# Patient Record
Sex: Female | Born: 1955 | Race: White | Hispanic: No | Marital: Married | Smoking: Former smoker
Health system: Southern US, Community
[De-identification: ages and names within clinical notes are randomized; demographics above are authoritative.]

## PROBLEM LIST (undated history)

## (undated) VITALS — BP 126/80 | HR 92 | Temp 98.3°F | Resp 18 | Ht 61.42 in | Wt 147.0 lb

## (undated) DIAGNOSIS — K219 Gastro-esophageal reflux disease without esophagitis: Secondary | ICD-10-CM

## (undated) DIAGNOSIS — F419 Anxiety disorder, unspecified: Secondary | ICD-10-CM

## (undated) DIAGNOSIS — IMO0002 Reserved for concepts with insufficient information to code with codable children: Secondary | ICD-10-CM

## (undated) DIAGNOSIS — J449 Chronic obstructive pulmonary disease, unspecified: Secondary | ICD-10-CM

## (undated) DIAGNOSIS — M1990 Unspecified osteoarthritis, unspecified site: Secondary | ICD-10-CM

## (undated) DIAGNOSIS — M810 Age-related osteoporosis without current pathological fracture: Secondary | ICD-10-CM

## (undated) DIAGNOSIS — E785 Hyperlipidemia, unspecified: Secondary | ICD-10-CM

## (undated) DIAGNOSIS — T7840XA Allergy, unspecified, initial encounter: Secondary | ICD-10-CM

## (undated) DIAGNOSIS — F1020 Alcohol dependence, uncomplicated: Secondary | ICD-10-CM

## (undated) HISTORY — DX: Unspecified osteoarthritis, unspecified site: M19.90

## (undated) HISTORY — PX: TUBAL LIGATION: SHX77

## (undated) HISTORY — DX: Anxiety disorder, unspecified: F41.9

## (undated) HISTORY — DX: Chronic obstructive pulmonary disease, unspecified: J44.9

## (undated) HISTORY — DX: Allergy, unspecified, initial encounter: T78.40XA

## (undated) HISTORY — DX: Gastro-esophageal reflux disease without esophagitis: K21.9

## (undated) HISTORY — PX: HERNIA REPAIR: SHX51

## (undated) HISTORY — PX: JOINT REPLACEMENT: SHX530

## (undated) HISTORY — PX: CYSTOCELE REPAIR: SHX163

---

## 2012-08-14 ENCOUNTER — Emergency Department (INDEPENDENT_AMBULATORY_CARE_PROVIDER_SITE_OTHER)
Admission: EM | Admit: 2012-08-14 | Discharge: 2012-08-14 | Disposition: A | Discharge status: Home / Self Care | Source: Home / Self Care | Attending: Emergency Medicine | Admitting: Emergency Medicine

## 2012-08-14 ENCOUNTER — Encounter: Payer: Self-pay | Admitting: *Deleted

## 2012-08-14 DIAGNOSIS — H01003 Unspecified blepharitis right eye, unspecified eyelid: Secondary | ICD-10-CM

## 2012-08-14 DIAGNOSIS — H01009 Unspecified blepharitis unspecified eye, unspecified eyelid: Secondary | ICD-10-CM

## 2012-08-14 HISTORY — DX: Hyperlipidemia, unspecified: E78.5

## 2012-08-14 HISTORY — DX: Age-related osteoporosis without current pathological fracture: M81.0

## 2012-08-14 MED ORDER — ERYTHROMYCIN 2 % EX OINT: TOPICAL_OINTMENT | CUTANEOUS | Status: DC

## 2012-08-14 MED ORDER — LEVOCETIRIZINE DIHYDROCHLORIDE 5 MG PO TABS: 5.0000 mg | ORAL_TABLET | Freq: Every evening | ORAL | Status: DC

## 2012-08-14 NOTE — ED Notes (Signed)
Pt c/o red, dry and cracking skin on her lids on and off x . She has applied Vaseline with minimal relief. She also c/o sneezing, runny nose and allergy s/s x 4 days.

## 2012-08-14 NOTE — ED Provider Notes (Signed)
History     CSN: [id number]  Arrival date & time 08/14/12  4098   First MD Initiated Contact with Patient 08/14/12 [phone]      Chief Complaint  Patient presents with  . Eye Problem    (Consider location/radiation/quality/duration/timing/severity/associated sxs/prior treatment) HPI Ann [name] presents today with an EYE COMPLAINT.  She has noticed some dry, cracking, scaly on her eyelids off and on for the last few months.  She went to another facility who suggested that she apply Vaseline but it has not been helping very much.  She's also been having some her normal allergy symptoms such as sneezing runny nose for the last week.  She has used [facility] [facility] in the past which is not tender help her very much.  She is mostly concerned about the dry eyelids.  Location: bilateral  Onset: 4 months   Symptoms: Redness: no Discharge: no Pain: no Photophobia: no Decreased Vision: no URI symptoms: no Itching/Allergy sxs: yes Glaucoma: no Recent eye surgery: no Contact lens use: no  Red Flags Trauma: no Foreign Body: no Vomiting/HA: no Halos around lights: no Chickenpox or zoster: no    Past Medical History  Diagnosis Date  . Hyperlipemia   . Osteoporosis     Past Surgical History  Procedure Laterality Date  . Tubal ligation    . Cystocele repair      Family History  Problem Relation Age of Onset  . Cancer Mother     breast  . Cancer Father     prostate    History  Substance Use Topics  . Smoking status: Current Every Day Smoker -- 0.50 packs/day  . Smokeless tobacco: Not on file  . Alcohol Use: Yes    OB History   Grav Para Term Preterm Abortions TAB SAB Ect Mult Living                  Review of Systems  All other systems reviewed and are negative.    Allergies  Naproxen  Home Medications   Current Outpatient Rx  Name  Route  Sig  Dispense  Refill  . denosumab (PROLIA) 60 MG/ML SOLN injection   Subcutaneous   Inject 60 mg into the skin every 6 (six)  months. Administer in upper arm, thigh, or abdomen         . simvastatin (ZOCOR) 40 MG tablet   Oral   Take 40 mg by mouth every evening.         Marland Kitchen VITAMIN D, CHOLECALCIFEROL, PO   Oral   Take by mouth.         . Erythromycin 2 % ointment      Apply to eyelids 1-2 times per day   25 g   1   . levocetirizine (XYZAL) 5 MG tablet   Oral   Take 1 tablet (5 mg total) by mouth every evening.   30 tablet   2     BP 122/77  Pulse 83  Temp(Src) 98.2 F (36.8 C) (Oral)  Resp 18  Ht 5\' 3"  (1.6 m)  Wt 144 lb (65.318 kg)  BMI 25.51 kg/m2  SpO2 98%  Physical Exam  Nursing note and vitals reviewed. Constitutional: She is oriented to person, place, and time. She appears well-developed and well-nourished.  HENT:  Head: Normocephalic and atraumatic.  Eyes: EOM are normal. Pupils are equal, round, and reactive to light. Right eye exhibits no discharge and no exudate. No foreign body present in the right eye. Left eye  exhibits no discharge and no exudate. No foreign body present in the left eye. No scleral icterus.  Eyelids slightly erythematous, no cellulitis, dry skin, mild scaling, upper lids > lower.  Neck: Neck supple.  Cardiovascular: Regular rhythm and normal heart sounds.   Pulmonary/Chest: Effort normal and breath sounds normal. No respiratory distress.  Neurological: She is alert and oriented to person, place, and time.  Skin: Skin is warm and dry.  Psychiatric: She has a normal mood and affect. Her speech is normal.    ED Course  Procedures (including critical care time)  Labs Reviewed - No data to display No results found.   1. Blepharitis of both eyes       MDM   The patient appears to have blepharitis of both eyelids.  I instructed her on the treatment which include using diluted baby shampoo once or twice a day to remove the scales and open up the oil glands.  Also can use over-the-counter hydrocortisone cream but only for a few days.  I also gave her  prescription for topical erythromycin ointment which does tend to help this as well.  She also asked for a prescription antihistamine so I prescribed her Xyzal but educated her that probably over-the-counter medication such as Claritin, Allegra, or Zyrtec work just as well.  Educated her on avoiding allergens.  Patient to followup with her primary care physician for an ophthalmologist or if her eye symptoms are not improving.    Marlaine Hind, MD 08/14/12 [phone]

## 2012-08-26 ENCOUNTER — Emergency Department (INDEPENDENT_AMBULATORY_CARE_PROVIDER_SITE_OTHER)
Admission: EM | Admit: 2012-08-26 | Discharge: 2012-08-26 | Disposition: A | Discharge status: Home / Self Care | Source: Home / Self Care | Attending: Family Medicine | Admitting: Family Medicine

## 2012-08-26 ENCOUNTER — Encounter: Payer: Self-pay | Admitting: Emergency Medicine

## 2012-08-26 ENCOUNTER — Emergency Department

## 2012-08-26 ENCOUNTER — Telehealth: Payer: Self-pay | Admitting: Emergency Medicine

## 2012-08-26 ENCOUNTER — Emergency Department (INDEPENDENT_AMBULATORY_CARE_PROVIDER_SITE_OTHER)

## 2012-08-26 DIAGNOSIS — M7062 Trochanteric bursitis, left hip: Secondary | ICD-10-CM

## 2012-08-26 DIAGNOSIS — M1610 Unilateral primary osteoarthritis, unspecified hip: Secondary | ICD-10-CM

## 2012-08-26 DIAGNOSIS — M7061 Trochanteric bursitis, right hip: Secondary | ICD-10-CM

## 2012-08-26 DIAGNOSIS — M76899 Other specified enthesopathies of unspecified lower limb, excluding foot: Secondary | ICD-10-CM

## 2012-08-26 DIAGNOSIS — M1612 Unilateral primary osteoarthritis, left hip: Secondary | ICD-10-CM

## 2012-08-26 DIAGNOSIS — M545 Low back pain, unspecified: Secondary | ICD-10-CM

## 2012-08-26 DIAGNOSIS — M25559 Pain in unspecified hip: Secondary | ICD-10-CM

## 2012-08-26 DIAGNOSIS — M169 Osteoarthritis of hip, unspecified: Secondary | ICD-10-CM

## 2012-08-26 MED ORDER — TRAMADOL HCL 50 MG PO TABS: ORAL_TABLET | ORAL | Status: DC

## 2012-08-26 NOTE — ED Notes (Signed)
Low back pain and hip pain after walking around at the [facility] yesterday 7/10, constant

## 2012-08-26 NOTE — ED Provider Notes (Signed)
History     CSN: [id number]  Arrival date & time 08/26/12  1134   First MD Initiated Contact with Patient 08/26/12 1159      Chief Complaint  Patient presents with  . Back Pain       HPI Comments: Patient states that she was walking in the [employer] yesterday for about an hour and developed pain in both hips, worse on the left.  Later in the afternoon, about 4:30PM she developed lower back pain that has become worse this morning.  The pain occasionally radiates to her anterior thighs.  She now has pain with any movement.  No bowel or bladder dysfunction; no saddle numbness. She had a DEXA scan four months ago that showed osteoporosis.  She has had increasing pain in the lower back and both hips.  She was started on Prolia about two months ago.  Patient is a 57 y.o. female presenting with back pain. The history is provided by the patient.  Back Pain Location:  Sacro-iliac joint and gluteal region Quality:  Aching Radiates to: anterior thighs. Pain severity:  Moderate Pain is:  Same all the time Onset quality:  Gradual Duration:  1 day Timing:  Constant Progression:  Worsening Chronicity:  Recurrent Context comment:  Recent walking Relieved by:  Nothing Worsened by:  Ambulation, twisting and movement Ineffective treatments:  Heating pad Associated symptoms: no abdominal pain, no abdominal swelling, no bladder incontinence, no bowel incontinence, no chest pain, no fever, no headaches, no leg pain, no numbness, no paresthesias, no pelvic pain, no perianal numbness, no tingling, no weakness and no weight loss   Risk factors: hx of osteoporosis and menopause     Past Medical History  Diagnosis Date  . Hyperlipemia   . Osteoporosis     Past Surgical History  Procedure Laterality Date  . Tubal ligation    . Cystocele repair      Family History  Problem Relation Age of Onset  . Cancer Mother     breast  . Cancer Father     prostate    History  Substance Use  Topics  . Smoking status: Current Every Day Smoker -- 0.50 packs/day for 40 years  . Smokeless tobacco: Not on file  . Alcohol Use: Yes    OB History   Grav Para Term Preterm Abortions TAB SAB Ect Mult Living                  Review of Systems  Constitutional: Negative for fever and weight loss.  Cardiovascular: Negative for chest pain.  Gastrointestinal: Negative for abdominal pain and bowel incontinence.  Genitourinary: Negative for bladder incontinence and pelvic pain.  Musculoskeletal: Positive for back pain.  Neurological: Negative for tingling, weakness, numbness, headaches and paresthesias.    Allergies  Naproxen  Home Medications   Current Outpatient Rx  Name  Route  Sig  Dispense  Refill  . denosumab (PROLIA) 60 MG/ML SOLN injection   Subcutaneous   Inject 60 mg into the skin every 6 (six) months. Administer in upper arm, thigh, or abdomen         . Erythromycin 2 % ointment      Apply to eyelids 1-2 times per day   25 g   1   . levocetirizine (XYZAL) 5 MG tablet   Oral   Take 1 tablet (5 mg total) by mouth every evening.   30 tablet   2   . simvastatin (ZOCOR) 40 MG tablet  Oral   Take 40 mg by mouth every evening.         . traMADol (ULTRAM) 50 MG tablet      Take one or two tabs by mouth at bedtime as needed for pain   15 tablet   0   . VITAMIN D, CHOLECALCIFEROL, PO   Oral   Take by mouth.           BP 132/83  Pulse 90  Temp(Src) 98.4 F (36.9 C) (Oral)  Ht 5\' 3"  (1.6 m)  Wt 140 lb (63.504 kg)  BMI 24.81 kg/m2  SpO2 96%  Physical Exam Nursing notes and Vital Signs reviewed. Appearance:  Patient appears healthy, stated age, and in no acute distress Eyes:  Pupils are equal, round, and reactive to light and accomodation.  Extraocular movement is intact.  Conjunctivae are not inflamed  Pharynx:  Normal Neck:  Supple.  No adenopathy Lungs:  Clear to auscultation.  Breath sounds are equal.  Heart:  Regular rate and rhythm  without murmurs, rubs, or gallops.  Abdomen:  Nontender without masses or hepatosplenomegaly.  Bowel sounds are present.  No CVA or flank tenderness.  Extremities:  No edema.  No calf tenderness.  Both hips reveals distinct tenderness over the greater trochanters, worse on the left.  Palpating the greater trochanters during resisted lateral abduction of the hips recreates her pain.  Skin:  No rash present.  Back:  Decreased range of motion.  Can heel/toe walk and squat without difficulty.   There is tenderness in the mid-line approximately L3-L4.  There is tenderness over both SI joints.  Straight leg raising test is negative.  Sitting knee extension test is negative.  Strength and sensation in the lower extremities is normal.  Patellar and achilles reflexes are normal     ED Course  Procedures  none   Dg Pelvis 1-2 Views  08/26/2012  *RADIOLOGY REPORT*  Clinical Data: Bilateral hip pain.  PELVIS - 1-2 VIEW  Comparison: None  Findings: Both hips are normally located.  There are moderate degenerative changes on the left with joint space narrowing and osteophytic spurring.  No findings for acute hip fracture or avascular necrosis.  The pubic symphysis and SI joints are intact. No degenerative changes.  IMPRESSION:  1.  Left hip joint degenerative changes. 2.  No acute bony findings.   Original Report Authenticated By: Rudie Meyer, M.D.      1. Osteoarthritis of left hip   2. Low back pain; suspect SI joint pain   3. Trochanteric bursitis of both hips       MDM  Rx written for tramadol at bedtime. Because of chronic nature of patient's hip and back pain, will refer to Dr. Rodney Langton for management.        Lattie Haw, MD 08/26/12 [phone]

## 2012-08-28 DIAGNOSIS — M51369 Other intervertebral disc degeneration, lumbar region without mention of lumbar back pain or lower extremity pain: Secondary | ICD-10-CM | POA: Insufficient documentation

## 2012-08-28 DIAGNOSIS — M5136 Other intervertebral disc degeneration, lumbar region: Secondary | ICD-10-CM | POA: Insufficient documentation

## 2012-10-10 ENCOUNTER — Encounter: Payer: Self-pay | Admitting: *Deleted

## 2012-10-10 ENCOUNTER — Emergency Department (INDEPENDENT_AMBULATORY_CARE_PROVIDER_SITE_OTHER)
Admission: EM | Admit: 2012-10-10 | Discharge: 2012-10-10 | Disposition: A | Discharge status: Home / Self Care | Source: Home / Self Care | Attending: Emergency Medicine | Admitting: Emergency Medicine

## 2012-10-10 DIAGNOSIS — N390 Urinary tract infection, site not specified: Secondary | ICD-10-CM

## 2012-10-10 LAB — POCT URINALYSIS DIP (MANUAL ENTRY)
Nitrite, UA: POSITIVE
pH, UA: 5 (ref 5–8)

## 2012-10-10 MED ORDER — CIPROFLOXACIN HCL 500 MG PO TABS: 500.0000 mg | ORAL_TABLET | Freq: Two times a day (BID) | ORAL | Status: DC

## 2012-10-10 NOTE — ED Provider Notes (Signed)
History     CSN: [id number]  Arrival date & time 10/10/12  1615   First MD Initiated Contact with Patient 10/10/12 1625      Chief Complaint  Patient presents with  . Dysuria    (Consider location/radiation/quality/duration/timing/severity/associated sxs/prior treatment) HPI Ann [name] is a 57 y.o. female who presents today with UTI symptoms for 1 day.  Taking AZO x1 dose which helps slightly. + dysuria + frequency + urgency No hematuria No vaginal discharge No fever/chills No lower abdominal pain No new back pain No fatigue    Past Medical History  Diagnosis Date  . Hyperlipemia   . Osteoporosis     Past Surgical History  Procedure Laterality Date  . Tubal ligation    . Cystocele repair      Family History  Problem Relation Age of Onset  . Cancer Mother     breast  . Cancer Father     prostate    History  Substance Use Topics  . Smoking status: Current Every Day Smoker -- 0.50 packs/day for 40 years    Types: Cigarettes  . Smokeless tobacco: Never Used  . Alcohol Use: Yes    OB History   Grav Para Term Preterm Abortions TAB SAB Ect Mult Living                  Review of Systems  All other systems reviewed and are negative.    Allergies  Naproxen  Home Medications   Current Outpatient Rx  Name  Route  Sig  Dispense  Refill  . ciprofloxacin (CIPRO) 500 MG tablet   Oral   Take 1 tablet (500 mg total) by mouth 2 (two) times daily.   10 tablet   0   . denosumab (PROLIA) 60 MG/ML SOLN injection   Subcutaneous   Inject 60 mg into the skin every 6 (six) months. Administer in upper arm, thigh, or abdomen         . Erythromycin 2 % ointment      Apply to eyelids 1-2 times per day   25 g   1   . levocetirizine (XYZAL) 5 MG tablet   Oral   Take 1 tablet (5 mg total) by mouth every evening.   30 tablet   2   . simvastatin (ZOCOR) 40 MG tablet   Oral   Take 40 mg by mouth every evening.         . traMADol (ULTRAM) 50 MG tablet      Take one or two tabs by mouth at bedtime as needed for pain   15 tablet   0   . VITAMIN D, CHOLECALCIFEROL, PO   Oral   Take by mouth.           BP 133/80  Pulse 89  Temp(Src) 98.5 F (36.9 C) (Oral)  Resp 12  Ht 5\' 2"  (1.575 m)  Wt 139 lb (63.05 kg)  BMI 25.42 kg/m2  SpO2 95%  Physical Exam  Nursing note and vitals reviewed. Constitutional: She is oriented to person, place, and time. She appears well-developed and well-nourished.  HENT:  Head: Normocephalic and atraumatic.  Eyes: No scleral icterus.  Neck: Neck supple.  Cardiovascular: Regular rhythm and normal heart sounds.   Pulmonary/Chest: Effort normal and breath sounds normal. No respiratory distress.  Abdominal: Soft. Normal appearance and bowel sounds are normal. She exhibits no mass. There is no rebound, no guarding and no CVA tenderness.  Neurological: She is alert and oriented  to person, place, and time.  Skin: Skin is warm and dry.  Psychiatric: She has a normal mood and affect. Her speech is normal.    ED Course  Procedures (including critical care time)  Labs Reviewed  URINE CULTURE  POCT URINALYSIS DIP (MANUAL ENTRY)   No results found.   1. Urinary tract infection, site not specified    Results for orders placed during the hospital encounter of 10/10/12  POCT URINALYSIS DIP (MANUAL ENTRY)      Result Value Range   Color, UA       Clarity, UA clear     Glucose, UA =250     Bilirubin, UA small     Bilirubin, UA small (15)     Spec Grav, UA <=1.005  1.005 - 1.03   Blood, UA negative     pH, UA 5.0  5 - 8   Protein Ur, POC =100     Urobilinogen, UA 4.0  0 - 1   Nitrite, UA Positive     Leukocytes, UA large (3+)        MDM  1) Take the prescribed antibiotic as directed. 2) A urinalysis was done in clinic.  A urine culture is pending. 3) Follow up with your PCP or urologist if not improving or if worsening symptoms.   Marlaine Hind, MD 10/10/12 [phone]

## 2012-10-10 NOTE — ED Notes (Signed)
Sanii c/o dysuria and polyuria x today. Denies fever.

## 2012-10-13 ENCOUNTER — Telehealth: Payer: Self-pay

## 2012-10-13 LAB — URINE CULTURE

## 2012-10-13 NOTE — ED Notes (Signed)
Left a message on voice mail asking how patient is feeling and advising to call back with any questions or concerns.  

## 2012-10-16 ENCOUNTER — Telehealth: Payer: Self-pay | Admitting: *Deleted

## 2012-10-18 ENCOUNTER — Telehealth: Payer: Self-pay | Admitting: *Deleted

## 2013-05-15 ENCOUNTER — Emergency Department (INDEPENDENT_AMBULATORY_CARE_PROVIDER_SITE_OTHER)
Admission: EM | Admit: 2013-05-15 | Discharge: 2013-05-15 | Disposition: A | Discharge status: Home / Self Care | Source: Home / Self Care | Attending: Emergency Medicine | Admitting: Emergency Medicine

## 2013-05-15 ENCOUNTER — Encounter: Payer: Self-pay | Admitting: Emergency Medicine

## 2013-05-15 ENCOUNTER — Emergency Department (INDEPENDENT_AMBULATORY_CARE_PROVIDER_SITE_OTHER)

## 2013-05-15 DIAGNOSIS — S139XXA Sprain of joints and ligaments of unspecified parts of neck, initial encounter: Secondary | ICD-10-CM

## 2013-05-15 DIAGNOSIS — S161XXA Strain of muscle, fascia and tendon at neck level, initial encounter: Secondary | ICD-10-CM

## 2013-05-15 DIAGNOSIS — M47812 Spondylosis without myelopathy or radiculopathy, cervical region: Secondary | ICD-10-CM

## 2013-05-15 DIAGNOSIS — M5030 Other cervical disc degeneration, unspecified cervical region: Secondary | ICD-10-CM

## 2013-05-15 HISTORY — DX: Reserved for concepts with insufficient information to code with codable children: IMO0002

## 2013-05-15 MED ORDER — CYCLOBENZAPRINE HCL 5 MG PO TABS: ORAL_TABLET | ORAL | Status: DC

## 2013-05-15 MED ORDER — HYDROCODONE-ACETAMINOPHEN 5-325 MG PO TABS: 1.0000 | ORAL_TABLET | Freq: Four times a day (QID) | ORAL | Status: DC | PRN

## 2013-05-15 MED ORDER — PREDNISONE (PAK) 10 MG PO TABS: ORAL_TABLET | ORAL | Status: DC

## 2013-05-15 NOTE — ED Notes (Signed)
Ann [name] c/o awakening with neck pain x 9 days ago. Pain has since worsened and is now having HAs. For pain relief she has taken old rx of her husbands hydrocodone. She reports it helps but does not resolve the pain completely. Hx of x-ray showing degenerative changes in her neck.

## 2013-05-15 NOTE — ED Provider Notes (Signed)
CSN: [id number]     Arrival date & time 05/15/13  1400 History   First MD Initiated Contact with Patient 05/15/13 1413     Chief Complaint  Patient presents with  . Neck Pain   (Consider location/radiation/quality/duration/timing/severity/associated sxs/prior Treatment) Patient is a 58 y.o. female presenting with neck pain. The history is provided by the patient.  Neck Pain Pain location:  Occipital region (Posterior cervical in the midline and right greater than left) Quality:  Stabbing (gnawing) Pain radiates to:  R scapula Pain severity now: Was 7/10, now 2/10 after taking one of her husband's hydrocodone this morning. Pain is:  Unable to specify Onset quality:  Sudden (After awakening 9 days ago) Timing:  Constant Progression:  Worsening Chronicity:  New Context: not fall, not lifting a heavy object, not MVA and not recent injury   Worsened by:  Bending Ineffective treatments: Excedrin. Associated symptoms: headaches   Associated symptoms: no bladder incontinence, no bowel incontinence, no chest pain, no fever, no leg pain, no numbness, no paresis, no photophobia, no syncope, no tingling, no visual change and no weakness   Risk factors: no hx of head and neck radiation, no hx of spinal trauma and no recent head injury    No radiation to extremities. Her generalized posterior occipital headache is mild to moderate at times without any other focal neurologic symptoms. No visual symptoms or vertigo or lightheadedness or loss of consciousness No fever or coryza or URI symptoms Past Medical History  Diagnosis Date  . Hyperlipemia   . Osteoporosis   . Degenerative disc disease    Past Surgical History  Procedure Laterality Date  . Tubal ligation    . Cystocele repair     Family History  Problem Relation Age of Onset  . Cancer Mother     breast  . Cancer Father     prostate   History  Substance Use Topics  . Smoking status: Current Every Day Smoker -- 0.50 packs/day for  40 years    Types: Cigarettes  . Smokeless tobacco: Never Used  . Alcohol Use: Yes   OB History   Grav Para Term Preterm Abortions TAB SAB Ect Mult Living                 Review of Systems  Constitutional: Negative for fever.  Eyes: Negative for photophobia and visual disturbance.  Respiratory: Negative.   Cardiovascular: Negative.  Negative for chest pain and syncope.  Gastrointestinal: Negative for bowel incontinence.  Genitourinary: Negative for bladder incontinence.  Musculoskeletal: Positive for neck pain.  Neurological: Positive for headaches. Negative for tingling, weakness and numbness.  All other systems reviewed and are negative.    Allergies  Naproxen  Home Medications   Current Outpatient Rx  Name  Route  Sig  Dispense  Refill  . Docusate Calcium (STOOL SOFTENER PO)   Oral   Take by mouth.         . simvastatin (ZOCOR) 40 MG tablet   Oral   Take 40 mg by mouth every evening.         . cyclobenzaprine (FLEXERIL) 5 MG tablet      Take 1 or 2 every 8 hours as needed for muscle relaxant. Caution: May cause drowsiness.   20 tablet   0   . denosumab (PROLIA) 60 MG/ML SOLN injection   Subcutaneous   Inject 60 mg into the skin every 6 (six) months. Administer in upper arm, thigh, or abdomen         .  Erythromycin 2 % ointment      Apply to eyelids 1-2 times per day   25 g   1   . HYDROcodone-acetaminophen (NORCO/VICODIN) 5-325 MG per tablet   Oral   Take 1-2 tablets by mouth every 6 (six) hours as needed for severe pain. Take with food.   12 tablet   0   . levocetirizine (XYZAL) 5 MG tablet   Oral   Take 1 tablet (5 mg total) by mouth every evening.   30 tablet   2   . predniSONE (STERAPRED UNI-PAK) 10 MG tablet      Take as directed for 6 days.   21 tablet   0   . VITAMIN D, CHOLECALCIFEROL, PO   Oral   Take by mouth.          BP 119/81  Pulse 91  Temp(Src) 99 F (37.2 C) (Oral)  Resp 14  Wt 140 lb (63.504 kg)  SpO2  96% Physical Exam  Nursing note and vitals reviewed. Constitutional: She is oriented to person, place, and time. She appears well-developed and well-nourished.  Non-toxic appearance. She appears distressed (Uncomfortable from neck pain.).  HENT:  Head: Normocephalic and atraumatic. Head is without abrasion and without contusion.  Right Ear: External ear normal.  Left Ear: External ear normal.  Nose: Nose normal.  Mouth/Throat: Oropharynx is clear and moist.  Eyes: Conjunctivae are normal. Pupils are equal, round, and reactive to light. No scleral icterus.  Neck: Trachea normal. Neck supple. Normal carotid pulses present. No mass present.  Cardiovascular: Regular rhythm and normal heart sounds.   Pulmonary/Chest: Effort normal and breath sounds normal. No respiratory distress.  Musculoskeletal:       Right shoulder: Normal.       Left shoulder: Normal.       Cervical back: She exhibits decreased range of motion, tenderness, bony tenderness and spasm (Posterior cervical muscles.). She exhibits no swelling, no edema, no deformity, no laceration and normal pulse.       Thoracic back: Normal.       Lumbar back: Normal.  There is also tenderness and spasm in bilat para-scapular muscles  Lymphadenopathy:       Head (right side): No occipital adenopathy present.       Head (left side): No occipital adenopathy present.    She has no cervical adenopathy.  Neurological: She is alert and oriented to person, place, and time. She has normal strength and normal reflexes. She displays no atrophy and no tremor. No cranial nerve deficit or sensory deficit. She exhibits normal muscle tone. Gait normal.  Reflex Scores:      Tricep reflexes are 2+ on the right side and 2+ on the left side.      Bicep reflexes are 2+ on the right side and 2+ on the left side.      Brachioradialis reflexes are 2+ on the right side and 2+ on the left side.      Patellar reflexes are 2+ on the right side and 2+ on the left  side.      Achilles reflexes are 2+ on the right side and 2+ on the left side. Skin: Skin is warm, dry and intact. No lesion and no rash noted.  Psychiatric: She has a normal mood and affect.    ED Course  Procedures (including critical care time) Labs Review Labs Reviewed - No data to display Imaging Review Dg Cervical Spine Complete  05/15/2013   CLINICAL DATA:  Pain.  EXAM: CERVICAL SPINE  4+ VIEWS  COMPARISON:  None.  FINDINGS: Diffuse degenerative change cervical spine with particular prominent disc space loss at C4-C5 and C5-C6. Degenerative endplate osteophyte formation and facet hypertrophy is present. Multilevel mild bilateral neural foraminal narrowing is present. Mild apical pleural thickening noted most consistent with scarring. Pulmonary apices are otherwise normal. Bilateral cervical ribs are noted at C7.  IMPRESSION: 1. Degenerative changes cervical spine with particular prominent disc space loss at C4-C5 and C5-C6. Multilevel bilateral mild neural foraminal narrowing. 2. Small bilateral cervical ribs are noted at C7. 3. No evidence of fracture or dislocation.   Electronically Signed   By: Maisie Fus  Register   On: 05/15/2013 14:53    EKG Interpretation    Date/Time:    Ventricular Rate:    PR Interval:    QRS Duration:   QT Interval:    QTC Calculation:   R Axis:     Text Interpretation:              MDM   1. Acute strain of neck muscle   2. Degenerative disc disease, cervical    X-ray C-spine shows no evidence of fracture or dislocation, however there is degenerative disc disease, especially C4-5 and C5-6. see report above for details. No focal neurologic symptoms or deficits on exam today.  Treatment options discussed, as well as risks, benefits, alternatives. Patient voiced understanding and agreement with the following plans: Prednisone 10 mg-6 day Dosepak Vicodin, #12. No refills. She understands we cannot prescribe this chronically. 1 at bedtime when  necessary severe pain. Flexeril 5-10 mg every 8 hours when necessary muscle relaxant but precautions discussed. Tylenol when necessary mild to moderate pain. Note that she is allergic to Naprosyn and she feels she is allergic to all other NSAIDs, so I'm not prescribing any NSAIDs today.  Followup with PCP or orthopedist if not improving 10 days, sooner if worse or new symptoms.  We discussed non-pharmacologic measures, including physical therapy. At her request, I agree with ordering physical therapy for the next 2 weeks . She understands that if any further physical therapy is warranted, then her PCP or orthopedist would need to authorize this. Precautions discussed. Red flags discussed. Questions invited and answered. Patient voiced understanding and agreement.     Lajean Manes, MD 05/15/13 [phone]

## 2013-05-15 NOTE — ED Notes (Signed)
Referral made to PT here @ [facility] per Dr. Rosanne AshingMassey's request. Pt took referral form with her and will make apt while she is here.

## 2013-09-10 ENCOUNTER — Emergency Department
Admission: EM | Admit: 2013-09-10 | Discharge: 2013-09-10 | Discharge status: Absent without leave | Source: Home / Self Care

## 2013-09-11 ENCOUNTER — Emergency Department (INDEPENDENT_AMBULATORY_CARE_PROVIDER_SITE_OTHER)
Admission: EM | Admit: 2013-09-11 | Discharge: 2013-09-11 | Disposition: A | Discharge status: Home / Self Care | Source: Home / Self Care | Attending: Emergency Medicine | Admitting: Emergency Medicine

## 2013-09-11 ENCOUNTER — Encounter: Payer: Self-pay | Admitting: Emergency Medicine

## 2013-09-11 DIAGNOSIS — L237 Allergic contact dermatitis due to plants, except food: Secondary | ICD-10-CM

## 2013-09-11 DIAGNOSIS — L255 Unspecified contact dermatitis due to plants, except food: Secondary | ICD-10-CM

## 2013-09-11 MED ORDER — HALOBETASOL PROPIONATE 0.05 % EX CREA: TOPICAL_CREAM | Freq: Two times a day (BID) | CUTANEOUS | Status: DC

## 2013-09-11 MED ORDER — PREDNISONE (PAK) 10 MG PO TABS: ORAL_TABLET | ORAL | Status: DC

## 2013-09-11 MED ORDER — HYDROXYZINE HCL 10 MG PO TABS: 10.0000 mg | ORAL_TABLET | ORAL | Status: DC | PRN

## 2013-09-11 NOTE — ED Provider Notes (Signed)
CSN: [id number]     Arrival date & time 09/11/13  1350 History   First MD Initiated Contact with Patient 09/11/13 1418     Chief Complaint  Patient presents with  . Dermatitis   (Consider location/radiation/quality/duration/timing/severity/associated sxs/prior Treatment) HPI Reports working in yard a week ago leading to progressive spread of rash on arms, scattered less intensely on back, legs and between toes; none on face.   Past Medical History  Diagnosis Date  . Hyperlipemia   . Osteoporosis   . Degenerative disc disease    Past Surgical History  Procedure Laterality Date  . Tubal ligation    . Cystocele repair     Family History  Problem Relation Age of Onset  . Cancer Mother     breast  . Cancer Father     prostate   History  Substance Use Topics  . Smoking status: Current Every Day Smoker -- 0.50 packs/day for 40 years    Types: Cigarettes  . Smokeless tobacco: Never Used  . Alcohol Use: Yes   OB History   Grav Para Term Preterm Abortions TAB SAB Ect Mult Living                 Review of Systems  All other systems reviewed and are negative.   Allergies  Naproxen  Home Medications   Prior to Admission medications   Medication Sig Start Date End Date Taking? Authorizing Provider  cyclobenzaprine (FLEXERIL) 5 MG tablet Take 1 or 2 every 8 hours as needed for muscle relaxant. Caution: May cause drowsiness. 05/15/13   Lajean Manesavid Massey, MD  denosumab (PROLIA) 60 MG/ML SOLN injection Inject 60 mg into the skin every 6 (six) months. Administer in upper arm, thigh, or abdomen    Historical Provider, MD  Docusate Calcium (STOOL SOFTENER PO) Take by mouth.    Historical Provider, MD  Erythromycin 2 % ointment Apply to eyelids 1-2 times per day 08/14/12   Marlaine HindJeffrey H Henderson, MD  halobetasol (ULTRAVATE) 0.05 % cream Apply topically 2 (two) times daily. 09/11/13   Lajean Manesavid Massey, MD  HYDROcodone-acetaminophen (NORCO/VICODIN) 5-325 MG per tablet Take 1-2 tablets by mouth  every 6 (six) hours as needed for severe pain. Take with food. 05/15/13   Lajean Manesavid Massey, MD  hydrOXYzine (ATARAX/VISTARIL) 10 MG tablet Take 1 tablet (10 mg total) by mouth every 4 (four) hours as needed for itching. Take 2 at bedtime for itch . Caution: May cause drowsiness 09/11/13   Lajean Manesavid Massey, MD  hydrOXYzine (ATARAX/VISTARIL) 10 MG tablet Take 1 tablet (10 mg total) by mouth every 4 (four) hours as needed for itching. Caution: May cause drowsiness. Take 2 at bedtime for itch 09/11/13   Lajean Manesavid Massey, MD  levocetirizine (XYZAL) 5 MG tablet Take 1 tablet (5 mg total) by mouth every evening. 08/14/12   Marlaine HindJeffrey H Henderson, MD  predniSONE (STERAPRED UNI-PAK) 10 MG tablet Take as directed for 6 days. 05/15/13   Lajean Manesavid Massey, MD  predniSONE (STERAPRED UNI-PAK) 10 MG tablet Take as directed for 6 days.--Take 6 on day 1, 5 on day 2, 4 on day 3, then 3 on day 4, then 2  on day 5, then 1 on day 6. Take with food 09/11/13   Lajean Manesavid Massey, MD  simvastatin (ZOCOR) 40 MG tablet Take 40 mg by mouth every evening.    Historical Provider, MD  VITAMIN D, CHOLECALCIFEROL, PO Take by mouth.    Historical Provider, MD   BP 112/73  Pulse 94  Temp(Src) 98.2  F (36.8 C) (Oral)  Resp 16  Ht 5\' 3"  (1.6 m)  Wt 135 lb (61.236 kg)  BMI 23.92 kg/m2  SpO2 100% Physical Exam  Nursing note and vitals reviewed. Constitutional: She is oriented to person, place, and time. She appears well-developed and well-nourished. No distress.  HENT:  Head: Normocephalic and atraumatic.  Eyes: Conjunctivae and EOM are normal. Pupils are equal, round, and reactive to light. No scleral icterus.  Neck: Normal range of motion.  Cardiovascular: Normal rate.   Pulmonary/Chest: Effort normal.  Abdominal: She exhibits no distension.  Musculoskeletal: Normal range of motion.  Neurological: She is alert and oriented to person, place, and time.  Skin: Skin is warm.  Severe poison ivy rash, erythematous, some in clusters some streaks, both upper  extremities especially right arm but also neck trunk and lower extremities.  Psychiatric: She has a normal mood and affect.    ED Course  Procedures (including critical care time) Labs Review Labs Reviewed - No data to display  Imaging Review No results found.   MDM   1. Poison ivy dermatitis    Treatment options discussed, as well as risks, benefits, alternatives. Patient voiced understanding and agreement with the following plans: Prednisone 10 mg-6 day dosepak Hydroxyzine when necessary itch Ultravate cream She refused IM methylprednisolone. Other symptomatic care discussed Follow-up with your primary care doctor in 5-7 days if not improving, or sooner if symptoms become worse. Precautions discussed. Red flags discussed. Questions invited and answered. Patient voiced understanding and agreement.      Lajean Manesavid Massey, MD 09/11/13 1450

## 2013-09-11 NOTE — ED Notes (Signed)
Reports working in yard a week ago leading to progressive spread of rash on arms, scattered less intensely on back, legs and between toes; none on face.

## 2013-09-12 ENCOUNTER — Encounter: Payer: Self-pay | Admitting: Sports Medicine

## 2013-09-12 ENCOUNTER — Ambulatory Visit (INDEPENDENT_AMBULATORY_CARE_PROVIDER_SITE_OTHER): Admitting: Sports Medicine

## 2013-09-12 VITALS — BP 145/87 | HR 99 | Ht 63.0 in | Wt 138.0 lb

## 2013-09-12 DIAGNOSIS — L237 Allergic contact dermatitis due to plants, except food: Secondary | ICD-10-CM | POA: Insufficient documentation

## 2013-09-12 DIAGNOSIS — Z87891 Personal history of nicotine dependence: Secondary | ICD-10-CM | POA: Insufficient documentation

## 2013-09-12 DIAGNOSIS — E785 Hyperlipidemia, unspecified: Secondary | ICD-10-CM

## 2013-09-12 DIAGNOSIS — F17200 Nicotine dependence, unspecified, uncomplicated: Secondary | ICD-10-CM

## 2013-09-12 DIAGNOSIS — Z299 Encounter for prophylactic measures, unspecified: Secondary | ICD-10-CM | POA: Insufficient documentation

## 2013-09-12 DIAGNOSIS — L255 Unspecified contact dermatitis due to plants, except food: Secondary | ICD-10-CM

## 2013-09-12 DIAGNOSIS — M810 Age-related osteoporosis without current pathological fracture: Secondary | ICD-10-CM

## 2013-09-12 MED ORDER — ALENDRONATE SODIUM 70 MG PO TABS: 70.0000 mg | ORAL_TABLET | ORAL | Status: DC

## 2013-09-12 MED ORDER — HYDROXYZINE HCL 50 MG PO TABS: 50.0000 mg | ORAL_TABLET | Freq: Three times a day (TID) | ORAL | Status: DC | PRN

## 2013-09-12 MED ORDER — SIMVASTATIN 40 MG PO TABS: 40.0000 mg | ORAL_TABLET | Freq: Every evening | ORAL | Status: DC

## 2013-09-12 NOTE — Assessment & Plan Note (Signed)
Meets criteria for CT screening. Ordering screening CT scan.

## 2013-09-12 NOTE — Progress Notes (Signed)
  Subjective:    CC: Establish care.   HPI:  Osteoporosis: Desires to switch to a pill.  Preventive measures: Up-to-date on colonoscopy, due for mammogram and cervical cancer screening.  Smoker: Greater than 30 pack years, desires lung cancer screening.  Poison ivy dermatitis: Improving significantly with prednisone and halobetasol prescribed by urgent care provider. Symptoms are moderate, persistent. Present for several days. Localized on both arms.  Past medical history, Surgical history, Family history not pertinant except as noted below, Social history, Allergies, and medications have been entered into the medical record, reviewed, and no changes needed.   Review of Systems: No headache, visual changes, nausea, vomiting, diarrhea, constipation, dizziness, abdominal pain, skin rash, fevers, chills, night sweats, swollen lymph nodes, weight loss, chest pain, body aches, joint swelling, muscle aches, shortness of breath, mood changes, visual or auditory hallucinations.  Objective:    General: Well Developed, well nourished, and in no acute distress.  Neuro: Alert and oriented x3, extra-ocular muscles intact, sensation grossly intact.  HEENT: Normocephalic, atraumatic, pupils equal round reactive to light, neck supple, no masses, no lymphadenopathy, thyroid nonpalpable.  Skin: Warm and dry, poison ivy dermatitis with papular pruritic lesions present on both arms.  Cardiac: Regular rate and rhythm, no murmurs rubs or gallops.  Respiratory: Clear to auscultation bilaterally. Not using accessory muscles, speaking in full sentences.  Abdominal: Soft, nontender, nondistended, positive bowel sounds, no masses, no organomegaly.  Musculoskeletal: Shoulder, elbow, wrist, hip, knee, ankle stable, and with full range of motion.  Impression and Recommendations:    The patient was counselled, risk factors were discussed, anticipatory guidance given.

## 2013-09-12 NOTE — Assessment & Plan Note (Signed)
Refilling simvastatin. Recheck lipids in a month, then a solid year.

## 2013-09-12 NOTE — Assessment & Plan Note (Signed)
Continue prednisone and halobetasol prescribed urgent care provider.

## 2013-09-12 NOTE — Assessment & Plan Note (Addendum)
Up-to-date on colonoscop, done in 2008, referral for mammogram and cervical cancer screening.

## 2013-09-12 NOTE — Assessment & Plan Note (Signed)
Switching from Prolia to Fosamax. If she desires we can recheck a bone density test a couple of years.

## 2013-09-18 ENCOUNTER — Ambulatory Visit (INDEPENDENT_AMBULATORY_CARE_PROVIDER_SITE_OTHER)

## 2013-09-18 DIAGNOSIS — R911 Solitary pulmonary nodule: Secondary | ICD-10-CM

## 2013-09-18 DIAGNOSIS — F17200 Nicotine dependence, unspecified, uncomplicated: Secondary | ICD-10-CM

## 2013-09-18 DIAGNOSIS — N289 Disorder of kidney and ureter, unspecified: Secondary | ICD-10-CM

## 2013-09-18 DIAGNOSIS — Z1231 Encounter for screening mammogram for malignant neoplasm of breast: Secondary | ICD-10-CM

## 2013-09-18 DIAGNOSIS — J438 Other emphysema: Secondary | ICD-10-CM

## 2013-09-19 DIAGNOSIS — R911 Solitary pulmonary nodule: Secondary | ICD-10-CM | POA: Insufficient documentation

## 2013-09-19 NOTE — Assessment & Plan Note (Signed)
CT scan is negative for any large masses. There are scattered nodules, likely hammertoes, we do need to recheck a CT scan in one year to ensure stability.

## 2013-10-10 ENCOUNTER — Ambulatory Visit: Admitting: Sports Medicine

## 2013-10-13 ENCOUNTER — Emergency Department (INDEPENDENT_AMBULATORY_CARE_PROVIDER_SITE_OTHER)
Admission: EM | Admit: 2013-10-13 | Discharge: 2013-10-13 | Disposition: A | Discharge status: Home / Self Care | Source: Home / Self Care | Attending: Emergency Medicine | Admitting: Emergency Medicine

## 2013-10-13 ENCOUNTER — Encounter: Payer: Self-pay | Admitting: Emergency Medicine

## 2013-10-13 DIAGNOSIS — N3000 Acute cystitis without hematuria: Secondary | ICD-10-CM

## 2013-10-13 DIAGNOSIS — R300 Dysuria: Secondary | ICD-10-CM

## 2013-10-13 DIAGNOSIS — N3001 Acute cystitis with hematuria: Secondary | ICD-10-CM

## 2013-10-13 LAB — POCT URINALYSIS DIP (MANUAL ENTRY)
Bilirubin, UA: NEGATIVE
Glucose, UA: 250
Nitrite, UA: POSITIVE
Protein Ur, POC: 30
Spec Grav, UA: <=1.005 (ref 1.005–1.03)
Urobilinogen, UA: 2 (ref 0–1)
pH, UA: 5 (ref 5–8)

## 2013-10-13 MED ORDER — CIPROFLOXACIN HCL 500 MG PO TABS: 500.0000 mg | ORAL_TABLET | Freq: Two times a day (BID) | ORAL | Status: DC

## 2013-10-13 NOTE — ED Notes (Signed)
Pt c/o dysuria x this AM. Denies fever. She took a PCN tablet and AZO today.

## 2013-10-13 NOTE — ED Provider Notes (Signed)
CSN: [id number]     Arrival date & time 10/13/13  1817 History   First MD Initiated Contact with Patient 10/13/13 1838     Chief Complaint  Patient presents with  . Dysuria   (Consider location/radiation/quality/duration/timing/severity/associated sxs/prior Treatment) HPI This is a 58 y.o. female who presents today with UTI symptoms for one day. + dysuria + frequency + urgency Slight hematuria No vaginal discharge No fever/chills No lower abdominal pain No nausea No vomiting No back pain No fatigue She denies chance of pregnancy. Has tried over-the-counter measures without improvement.    Past Medical History  Diagnosis Date  . Hyperlipemia   . Osteoporosis   . Degenerative disc disease    Past Surgical History  Procedure Laterality Date  . Tubal ligation    . Cystocele repair     Family History  Problem Relation Age of Onset  . Cancer Mother     breast  . Cancer Father     prostate   History  Substance Use Topics  . Smoking status: Current Every Day Smoker -- 0.50 packs/day for 40 years    Types: Cigarettes  . Smokeless tobacco: Never Used  . Alcohol Use: Yes   OB History   Grav Para Term Preterm Abortions TAB SAB Ect Mult Living                 Review of Systems  All other systems reviewed and are negative.   Allergies  Naproxen and Tramadol  Home Medications   Prior to Admission medications   Medication Sig Start Date End Date Taking? Authorizing Provider  alendronate (FOSAMAX) 70 MG tablet Take 1 tablet (70 mg total) by mouth every 7 (seven) days. Take with a full glass of water on an empty stomach. 09/12/13   Monica Bectonhomas J Thekkekandam, MD  ciprofloxacin (CIPRO) 500 MG tablet Take 1 tablet (500 mg total) by mouth 2 (two) times daily. For 7 days 10/13/13   Lajean Manesavid Massey, MD  Docusate Calcium (STOOL SOFTENER PO) Take by mouth.    Historical Provider, MD  halobetasol (ULTRAVATE) 0.05 % cream Apply topically 2 (two) times daily. 09/11/13   Lajean Manesavid Massey, MD   simvastatin (ZOCOR) 40 MG tablet Take 1 tablet (40 mg total) by mouth every evening. 09/12/13   Monica Bectonhomas J Thekkekandam, MD  VITAMIN D, CHOLECALCIFEROL, PO Take by mouth.    Historical Provider, MD   BP 117/76  Pulse 97  Temp(Src) 98.5 F (36.9 C) (Oral)  Resp 16  SpO2 98% Physical Exam  Nursing note and vitals reviewed. Constitutional: She is oriented to person, place, and time. She appears well-developed and well-nourished. No distress.  HENT:  Mouth/Throat: Oropharynx is clear and moist.  Eyes: No scleral icterus.  Neck: Neck supple.  Cardiovascular: Normal rate, regular rhythm and normal heart sounds.   Pulmonary/Chest: Breath sounds normal.  Abdominal: Soft. She exhibits no mass. There is no hepatosplenomegaly. There is tenderness in the suprapubic area. There is no rebound, no guarding and no CVA tenderness.  Lymphadenopathy:    She has no cervical adenopathy.  Neurological: She is alert and oriented to person, place, and time.  Skin: Skin is warm and dry.    ED Course  Procedures (including critical care time) Labs Review Labs Reviewed  URINE CULTURE  POCT URINALYSIS DIP (MANUAL ENTRY)    Imaging Review No results found.   MDM   1. Acute cystitis with hematuria   2. Dysuria    Urinalysis shows trace blood, positive nitrates and  large leukocytes.  Treatment options discussed, as well as risks, benefits, alternatives. Patient voiced understanding and agreement with the following plans:  Cipro 500 mg twice a day x7 days Urine culture Drink plenty of fluids and other advice given Follow-up with your primary care doctor in 5-7 days if not improving, or sooner if symptoms become worse. Precautions discussed. Red flags discussed. Questions invited and answered. Patient voiced understanding and agreement.     Lajean Manesavid Massey, MD 10/13/13 [id number]

## 2013-10-15 LAB — URINE CULTURE
Colony Count: NO GROWTH
Organism ID, Bacteria: NO GROWTH

## 2013-11-06 LAB — LIPID PANEL
Cholesterol: 215 mg/dL — ABNORMAL HIGH (ref 0–200)
HDL: 99 mg/dL (ref >39)
LDL Cholesterol: 92 mg/dL (ref 0–99)
Total CHOL/HDL Ratio: 2.2 Ratio
Triglycerides: 119 mg/dL (ref <150)
VLDL: 24 mg/dL (ref 0–40)

## 2013-11-06 LAB — COMPREHENSIVE METABOLIC PANEL WITH GFR
ALT: 25 U/L (ref 0–35)
Albumin: 3.9 g/dL (ref 3.5–5.2)
CO2: 25 meq/L (ref 19–32)
Calcium: 8.9 mg/dL (ref 8.4–10.5)
Chloride: 106 meq/L (ref 96–112)
Glucose, Bld: 90 mg/dL (ref 70–99)
Sodium: 140 meq/L (ref 135–145)
Total Protein: 6.6 g/dL (ref 6.0–8.3)

## 2013-11-06 LAB — TSH: TSH: 0.875 u[IU]/mL (ref 0.350–4.500)

## 2013-11-06 LAB — COMPREHENSIVE METABOLIC PANEL
AST: 36 U/L (ref 0–37)
Alkaline Phosphatase: 62 U/L (ref 39–117)
BUN: 9 mg/dL (ref 6–23)
Creat: 0.78 mg/dL (ref 0.50–1.10)
Potassium: 4.2 mEq/L (ref 3.5–5.3)
Total Bilirubin: 0.6 mg/dL (ref 0.2–1.2)

## 2013-11-06 LAB — CBC
HCT: 39.6 % (ref 36.0–46.0)
Hemoglobin: 12.7 g/dL (ref 12.0–15.0)
MCH: 31.6 pg (ref 26.0–34.0)
MCHC: 32.1 g/dL (ref 30.0–36.0)
MCV: 98.5 fL (ref 78.0–100.0)
Platelets: 267 K/uL (ref 150–400)
RBC: 4.02 MIL/uL (ref 3.87–5.11)
RDW: 13 % (ref 11.5–15.5)
WBC: 5.5 K/uL (ref 4.0–10.5)

## 2013-11-06 LAB — HEMOGLOBIN A1C
Hgb A1c MFr Bld: 5.6 % (ref <5.7)
Mean Plasma Glucose: 114 mg/dL (ref <117)

## 2014-02-23 ENCOUNTER — Ambulatory Visit (INDEPENDENT_AMBULATORY_CARE_PROVIDER_SITE_OTHER): Admitting: Sports Medicine

## 2014-02-23 ENCOUNTER — Encounter: Payer: Self-pay | Admitting: Sports Medicine

## 2014-02-23 VITALS — BP 125/70 | HR 104 | Ht 63.0 in | Wt 143.0 lb

## 2014-02-23 DIAGNOSIS — M25552 Pain in left hip: Secondary | ICD-10-CM | POA: Insufficient documentation

## 2014-02-23 NOTE — Progress Notes (Signed)
  Subjective:    CC: left hip pain  HPI: This is a pleasant 58 year old female with a history of hip osteoarthritis who comes in with pain on the lateral aspect of her left hip, moderate, persistent, no radiation, difficult to sleep on the side. She has taken over-the-counter NSAIDs without any improvement. She does desire interventional treatment.  She also wanted to discuss stress, and anxiety, but is amenable to discussing this in the future.  Past medical history, Surgical history, Family history not pertinant except as noted below, Social history, Allergies, and medications have been entered into the medical record, reviewed, and no changes needed.   Review of Systems: No fevers, chills, night sweats, weight loss, chest pain, or shortness of breath.   Objective:    General: Well Developed, well nourished, and in no acute distress.  Neuro: Alert and oriented x3, extra-ocular muscles intact, sensation grossly intact.  HEENT: Normocephalic, atraumatic, pupils equal round reactive to light, neck supple, no masses, no lymphadenopathy, thyroid nonpalpable.  Skin: Warm and dry, no rashes. Cardiac: Regular rate and rhythm, no murmurs rubs or gallops, no lower extremity edema.  Respiratory: Clear to auscultation bilaterally. Not using accessory muscles, speaking in full sentences. Left Hip: ROM IR: 60 Deg, ER: 60 Deg, Flexion: 120 Deg, Extension: 100 Deg, Abduction: 45 Deg, Adduction: 45 Deg mild reproduction of groin pain with internal rotation of the hip Strength IR: 5/5, ER: 5/5, Flexion: 5/5, Extension: 5/5, Abduction: 4-/5, Adduction: 5/5extremely weak to abduction. Pelvic alignment unremarkable to inspection and palpation. Standing hip rotation and gait without trendelenburg / unsteadiness. Greater trochanter with severetenderness to palpation. No tenderness over piriformis. No SI joint tenderness and normal minimal SI movement.  Procedure:  Injection of left greater trochanteric  bursa Consent obtained and verified. Time-out conducted. Noted no overlying erythema, induration, or other signs of local infection. Skin prepped in a sterile fashion. Topical analgesic spray: Ethyl chloride. Completed without difficulty. Meds:spinal needle advanced to the trochanteric bursa, 1 mL kenalog 40, 4 mL lidocaine injected easily. Pain immediately improved suggesting accurate placement of the medication. Advised to call if fevers/chills, erythema, induration, drainage, or persistent bleeding.  Impression and Recommendations:

## 2014-02-23 NOTE — Assessment & Plan Note (Signed)
There is some degenerative changes in the femoroacetabular joint however pain is referable to the distal gluteus medius and the greater trochanteric bursa. Trochanteric bursa injection as above, formal physical therapy.  Return in 4 weeks.

## 2014-02-27 ENCOUNTER — Ambulatory Visit: Admitting: Physical Therapy

## 2014-03-05 ENCOUNTER — Ambulatory Visit (INDEPENDENT_AMBULATORY_CARE_PROVIDER_SITE_OTHER): Admitting: Physical Therapy

## 2014-03-05 DIAGNOSIS — M25552 Pain in left hip: Secondary | ICD-10-CM

## 2014-03-05 DIAGNOSIS — R262 Difficulty in walking, not elsewhere classified: Secondary | ICD-10-CM

## 2014-03-09 ENCOUNTER — Ambulatory Visit: Admitting: Sports Medicine

## 2014-03-23 ENCOUNTER — Ambulatory Visit: Admitting: Sports Medicine

## 2014-05-13 ENCOUNTER — Telehealth: Payer: Self-pay

## 2014-05-13 MED ORDER — MELOXICAM 15 MG PO TABS: ORAL_TABLET | ORAL | Status: DC

## 2014-05-13 NOTE — Telephone Encounter (Signed)
Patient states she talked to the pharmacist about the meloxicam. She said since she has an allergy to naproxen she probably will have an allergy to meloxicam. I told her that since she is allergic to Tramadol there isn't anything else we could call in. A office visit is required for controlled medications.

## 2014-05-13 NOTE — Telephone Encounter (Signed)
Ann [name] reports left hip pain has return. She has schedule an appointment for Friday. She is in pain and is unable to sleep. She would like something for the pain. Please advise.

## 2014-05-13 NOTE — Telephone Encounter (Signed)
Calling in meloxicam. Did she do the formal physical therapy?

## 2014-05-15 ENCOUNTER — Ambulatory Visit: Admitting: Sports Medicine

## 2014-08-18 ENCOUNTER — Ambulatory Visit (INDEPENDENT_AMBULATORY_CARE_PROVIDER_SITE_OTHER): Admitting: Sports Medicine

## 2014-08-18 ENCOUNTER — Encounter: Payer: Self-pay | Admitting: Sports Medicine

## 2014-08-18 DIAGNOSIS — M810 Age-related osteoporosis without current pathological fracture: Secondary | ICD-10-CM

## 2014-08-18 DIAGNOSIS — M25552 Pain in left hip: Secondary | ICD-10-CM | POA: Diagnosis not present

## 2014-08-18 LAB — BASIC METABOLIC PANEL
CO2: 25 mEq/L (ref 19–32)
Creat: 0.77 mg/dL (ref 0.50–1.10)
Glucose, Bld: 80 mg/dL (ref 70–99)
Potassium: 4.5 mEq/L (ref 3.5–5.3)

## 2014-08-18 LAB — BASIC METABOLIC PANEL WITH GFR
BUN: 12 mg/dL (ref 6–23)
Calcium: 10.3 mg/dL (ref 8.4–10.5)
Chloride: 103 meq/L (ref 96–112)
Sodium: 143 meq/L (ref 135–145)

## 2014-08-18 MED ORDER — DENOSUMAB 60 MG/ML ~~LOC~~ SOLN: 60.0000 mg | SUBCUTANEOUS | Status: DC

## 2014-08-18 MED ORDER — PREDNISONE 10 MG (21) PO TBPK: ORAL_TABLET | ORAL | Status: DC

## 2014-08-18 MED ORDER — CALCIUM CARBONATE-VITAMIN D 600-400 MG-UNIT PO TABS: 1.0000 | ORAL_TABLET | Freq: Two times a day (BID) | ORAL | Status: DC

## 2014-08-18 NOTE — Assessment & Plan Note (Signed)
Persistent trochanteric bursitis, prednisone taper per patient request, formal physical therapy, she still has very weak hip abductor's on the left side.

## 2014-08-18 NOTE — Assessment & Plan Note (Signed)
Prolia per patient request. She tells me she has been approved for this already. Calcium levels and renal function has been normal

## 2014-08-18 NOTE — Progress Notes (Signed)

## 2014-08-20 ENCOUNTER — Ambulatory Visit (INDEPENDENT_AMBULATORY_CARE_PROVIDER_SITE_OTHER): Admitting: Physical Therapy

## 2014-08-20 ENCOUNTER — Encounter: Payer: Self-pay | Admitting: Physical Therapy

## 2014-08-20 DIAGNOSIS — R531 Weakness: Secondary | ICD-10-CM | POA: Diagnosis not present

## 2014-08-20 DIAGNOSIS — M545 Low back pain: Secondary | ICD-10-CM

## 2014-08-20 DIAGNOSIS — M25552 Pain in left hip: Secondary | ICD-10-CM

## 2014-08-20 NOTE — Patient Instructions (Signed)
Straight Leg Raise   Tighten stomach and slowly raise locked right leg __6-8__ inches from floor. Repeat __10__ times per set. Do _2___ sets per session. Do ___1_ sessions per day.    Hip Extension (Prone)   Lift left leg _2_ inches from floor, keeping knee locked. Repeat __10__ times per set. Do ____ sets per session. Do _1___ sessions per day.  Strengthening: Hip Abduction (Side2-Lying)   Tighten muscles on front of left thigh, then lift leg _6-8___ inches from surface, keeping knee locked.  Repeat _10___ times per set. Do _2___ sets per session. Do __1__ sessions per day.  http://orth.exer.us/622   Copyright  VHI. All rights reserved.  Strengthening: Hip Adduction (Side-Lying)  Outer Hip Stretch: Reclined IT Band Stretch (Strap) Tighten muscles on front of right thigh, then lift leg ___3_ inches from surface, keeping knee locked.  Repeat _10__ times per set. Do _2___ sets per session. Do _1___ sessions per day.     Strap around opposite foot, pull across only as far as possible with shoulders on mat. Hold for __4__ breaths. Repeat _4__ times each leg.    [facility] [address] [place], [place] [address]  [phone] (office) [phone] (fax)

## 2014-08-20 NOTE — Therapy (Signed)
[facility] [place] [address] [place] [address] [place], [place], [address] Phone: [phone]   Fax:  [phone]  Physical Therapy Evaluation  Patient Details  Name: Ann [name] MRN: [id number] Date of Birth: Jun 26, 1955 Referring Provider:  Monica Becton,*  Encounter Date: 08/20/2014      PT End of Session - 08/20/14 1616    Visit Number 1   Number of Visits 8   Date for PT Re-Evaluation 09/17/14   PT Start Time 1533   PT Stop Time 1614   PT Time Calculation (min) 41 min   Activity Tolerance Patient tolerated treatment well      Past Medical History  Diagnosis Date  . Hyperlipemia   . Osteoporosis   . Degenerative disc disease     Past Surgical History  Procedure Laterality Date  . Tubal ligation    . Cystocele repair      There were no vitals filed for this visit.  Visit Diagnosis:  Left hip pain - Plan: PT plan of care cert/re-cert  Low back pain without sciatica, unspecified back pain laterality - Plan: PT plan of care cert/re-cert  Weakness generalized - Plan: PT plan of care cert/re-cert      Subjective Assessment - 08/20/14 1542    Subjective Pt reports she was seen here in PT for one visit and then had to go out of town for a family emergency. Her pain was better since the injection and she did some of the exercises.  With returning home she stopped doing her HEP and her Lt hip pain has returned in January.    Pertinent History has started taking prednisone for 2 days, will take this for a while and see if it helps.     How long can you sit comfortably? no problem   How long can you stand comfortably? immediate pain   How long can you walk comfortably? tolerates ~ 10 feet without meds   Patient Stated Goals reduce pain, increase strength   Currently in Pain? No/denies  no pain with sitting, however getting out of her car the pain was 6/10 today            [facility] PT Assessment - 08/20/14 0001    Assessment   Medical Diagnosis Lt hip pain   Onset Date 05/09/14   Next MD Visit 09/15/14   Prior Therapy yes   Balance Screen   Has the patient fallen in the past 6 months No   Has the patient had a decrease in activity level because of a fear of falling?  No   Is the patient reluctant to leave their home because of a fear of falling?  No   Observation/Other Assessments   Focus on Therapeutic Outcomes (FOTO)  60% limited   ROM / Strength   AROM / PROM / Strength AROM;Strength   AROM   Overall AROM  --  LE's WNL except Lt single knee to chest, painful   Strength   Overall Strength --  Rt LE WNL, core has some weakness.    Strength Assessment Site Hip   Right/Left Hip Left   Left Hip Flexion 4+/5   Left Hip Extension 5/5   Left Hip ABduction 4/5   Flexibility   Soft Tissue Assessment /Muscle Length --  WNL   Palpation   Palpation hypersensitive posterior Lt greater troch and into buttocks/gluts and piriformis  pt with pain and hypomobility in Lspine L3 CPA and Lt UPA   Balance  Balance Assessed --  Single leg stance > 15 sec bilat                   [facility] Adult PT Treatment/Exercise - 08/20/14 0001    Bed Mobility   Bed Mobility Supine to Sit;Sit to Supine  pt instructed in logroll    Exercises   Exercises Knee/Hip   Knee/Hip Exercises: Stretches   ITB Stretch 2 reps;30 seconds   Knee/Hip Exercises: Supine   Straight Leg Raises Strengthening;Left;1 set;10 reps   Knee/Hip Exercises: Sidelying   Hip ABduction Strengthening;Left;1 set;10 reps   Hip ADduction Strengthening;Left;1 set;10 reps                PT Education - 08/20/14 1558    Education provided Yes   Education Details HEP   Person(s) Educated Patient   Methods Handout;Demonstration;Explanation   Comprehension Verbalized understanding;Returned demonstration             PT Long Term Goals - 08/20/14 1611    PT LONG TERM GOAL #1   Title I with advanced HEP   Time 4   Period  Weeks   Status New   PT LONG TERM GOAL #2   Title demo bilat hip strength =/> 5/5   Time 4   Period Weeks   Status New   PT LONG TERM GOAL #3   Title demo no more than 1/10 pain with palpation of Lt hip joint   Time 4   Period Weeks   Status New   PT LONG TERM GOAL #4   Title perform core work with good pelvic stability   Time 4   Period Weeks   Status New   PT LONG TERM GOAL #5   Title improve FOTO =/< 45% limited   Additional Long Term Goals   Additional Long Term Goals Yes   PT LONG TERM GOAL #6   Title demo Lt single knee to chest without increased pain   Time 4   Period Weeks   Status New               Plan - 08/20/14 1613    Clinical Impression Statement Pt presents with 4 month h/o returning Lt hip pain.  This had resolved last Nov after an injection and performing ther ex.  She now has pain and weakness in her Lt hip and core.  Pt also presents with back symptoms and hypomobility in her L spine that  bring on her pain .  Her h/o back pain may have caused her to change her walking pattern and led to the hip bursitis.  She would benefit from PT for the hip and core.    Pt will benefit from skilled therapeutic intervention in order to improve on the following deficits Difficulty walking;Impaired flexibility;Pain;Decreased strength   Rehab Potential Good   PT Frequency 2x / week   PT Duration 4 weeks   PT Treatment/Interventions Moist Heat;Therapeutic activities;Patient/family education;Passive range of motion;Therapeutic exercise;Ultrasound;Manual techniques;Cryotherapy;Electrical Stimulation  iontophoresis with dexamethasone          Problem List Patient Active Problem List   Diagnosis Date Noted  . Left hip pain 02/23/2014  . Pulmonary nodule 09/19/2013  . Hyperlipidemia 09/12/2013  . Poison ivy dermatitis 09/12/2013  . Preventive measure 09/12/2013  . Osteoporosis 09/12/2013  . Smoker 09/12/2013    Roderic Scarce, PT 08/20/2014, 4:21 PM  [facility] [address] [place] [address] [place], [place], [address] Phone: [phone]  Fax:  [phone]

## 2014-08-24 ENCOUNTER — Ambulatory Visit (INDEPENDENT_AMBULATORY_CARE_PROVIDER_SITE_OTHER): Admitting: Physical Therapy

## 2014-08-24 DIAGNOSIS — R531 Weakness: Secondary | ICD-10-CM

## 2014-08-24 DIAGNOSIS — M25552 Pain in left hip: Secondary | ICD-10-CM | POA: Diagnosis not present

## 2014-08-24 DIAGNOSIS — M545 Low back pain: Secondary | ICD-10-CM | POA: Diagnosis not present

## 2014-08-24 NOTE — Therapy (Signed)
[facility] [address] [place], [place], [address] Phone: [phone]   Fax:  [phone]  Physical Therapy Treatment  Patient Details  Name: Ann [name] MRN: [id number] Date of Birth: 1955/10/22 Referring Provider:  Monica Bectonhekkekandam, Thomas J,*  Encounter Date: 08/24/2014      PT End of Session - 08/24/14 1410    Visit Number 2   Number of Visits 8   Date for PT Re-Evaluation 09/17/14   PT Start Time 1405   PT Stop Time 1452   PT Time Calculation (min) 47 min   Activity Tolerance Patient limited by pain      Past Medical History  Diagnosis Date  . Hyperlipemia   . Osteoporosis   . Degenerative disc disease     Past Surgical History  Procedure Laterality Date  . Tubal ligation    . Cystocele repair      There were no vitals filed for this visit.  Visit Diagnosis:  Left hip pain  Low back pain without sciatica, unspecified back pain laterality  Weakness generalized      Subjective Assessment - 08/24/14 1407    Subjective "I think doing the exercises has woken up something in my lower back (nagging ache)"    Currently in Pain? Yes   Pain Score 1    Pain Location Groin   Pain Orientation Left   Pain Descriptors / Indicators Sore   Aggravating Factors  turning a certain way, stairs   Pain Relieving Factors medicine, rest             [facility] - 08/24/14 0001    Assessment   Medical Diagnosis Lt hip pain   Onset Date 05/09/14   Next MD Visit 09/15/14                     [facility] Treatment/Exercise - 08/24/14 0001    Bed Mobility   Bed Mobility Supine to Sit;Sit to Supine  reviewed with pt regarding logroll    Exercises   Exercises Knee/Hip   Knee/Hip Exercises: Aerobic   Stationary Bike NuStep L4 x 5 min    Knee/Hip Exercises: Standing   Other Standing Knee Exercises Lunge forward to stretch hip flexor Rt/Lt: VC for neutral pelvis) x 20 sec x 2 reps each.    Knee/Hip Exercises: Supine   Straight Leg Raises Left;1 set;10 reps   Other Supine Knee Exercises Lt hip flexor stretch off edge of table x 30 sec    Knee/Hip Exercises: Sidelying   Hip ABduction Strengthening;Left;1 set;10 reps  VC to activate core/decrease lumbar use   Clams Lt x 5 reps (increased pain).    Modalities   Modalities Electrical Stimulation;Cryotherapy   Cryotherapy   Number Minutes Cryotherapy 15 Minutes   Cryotherapy Location Hip;Back  Lt side   Type of Cryotherapy Ice pack   Electrical Stimulation   Electrical Stimulation Location Lt hip    Electrical Stimulation Action IFC    Electrical Stimulation Parameters 80/150 Hz - to tolerance x 15 min    Electrical Stimulation Goals Pain   Manual Therapy   Manual Therapy Other (comment)   Other Manual Therapy TPR to Lt piriformis, TFL.                      PT Long Term Goals - 08/20/14 1611    PT LONG TERM GOAL #1   Title I with advanced HEP   Time 4  Period Weeks   Status New   PT LONG TERM GOAL #2   Title demo bilat hip strength =/> 5/5   Time 4   Period Weeks   Status New   PT LONG TERM GOAL #3   Title demo no more than 1/10 pain with palpation of Lt hip joint   Time 4   Period Weeks   Status New   PT LONG TERM GOAL #4   Title perform core work with good pelvic stability   Time 4   Period Weeks   Status New   PT LONG TERM GOAL #5   Title improve FOTO =/< 45% limited   Additional Long Term Goals   Additional Long Term Goals Yes   PT LONG TERM GOAL #6   Title demo Lt single knee to chest without increased pain   Time 4   Period Weeks   Status New               Plan - 08/24/14 1652    Clinical Impression Statement Pt very point tender in Lt psoas, glute med, TFL and ITB with manual work.  Pt had some difficulty tolerating ther ex for Lt ant hip due to increased pain.  Pain in hip lessened with use of ice and estim.    Pt will benefit from skilled therapeutic intervention in  order to improve on the following deficits Difficulty walking;Impaired flexibility;Pain;Decreased strength   Rehab Potential Good   PT Frequency 2x / week   PT Duration 4 weeks   PT Treatment/Interventions Moist Heat;Therapeutic activities;Patient/family education;Passive range of motion;Therapeutic exercise;Ultrasound;Manual techniques;Cryotherapy;Electrical Stimulation   PT Next Visit Plan Assess response to estim/ice; continue manual to Lt hip and progress HEP.    Consulted and Agree with Plan of Care Patient        Problem List Patient Active Problem List   Diagnosis Date Noted  . Left hip pain 02/23/2014  . Pulmonary nodule 09/19/2013  . Hyperlipidemia 09/12/2013  . Poison ivy dermatitis 09/12/2013  . Preventive measure 09/12/2013  . Osteoporosis 09/12/2013  . Smoker 09/12/2013    Mayer Camel, PTA 08/24/2014 4:54 PM  [facility] [place] [address] [place], [place], [address] Phone: [phone]   Fax:  [phone]

## 2014-08-27 ENCOUNTER — Ambulatory Visit (INDEPENDENT_AMBULATORY_CARE_PROVIDER_SITE_OTHER): Admitting: Physical Therapy

## 2014-08-27 DIAGNOSIS — M25552 Pain in left hip: Secondary | ICD-10-CM | POA: Diagnosis not present

## 2014-08-27 DIAGNOSIS — R531 Weakness: Secondary | ICD-10-CM | POA: Diagnosis not present

## 2014-08-27 DIAGNOSIS — M545 Low back pain: Secondary | ICD-10-CM

## 2014-08-27 NOTE — Therapy (Signed)
[facility] [address] [place] [place], [place], [address] Phone: [phone]   Fax:  [phone]  Physical Therapy Treatment  Patient Details  Name: Ann [name] MRN: [id number] Date of Birth: 08/19/1955 Referring Provider:  Monica Bectonhekkekandam, Thomas J,*  Encounter Date: 08/27/2014      PT End of Session - 08/27/14 1412    Visit Number 3   Number of Visits 8   Date for PT Re-Evaluation 09/17/14   PT Start Time 1409   PT Stop Time 1447   PT Time Calculation (min) 38 min   Activity Tolerance Patient tolerated treatment well      Past Medical History  Diagnosis Date  . Hyperlipemia   . Osteoporosis   . Degenerative disc disease     Past Surgical History  Procedure Laterality Date  . Tubal ligation    . Cystocele repair      There were no vitals filed for this visit.  Visit Diagnosis:  Left hip pain  Low back pain without sciatica, unspecified back pain laterality  Weakness generalized      Subjective Assessment - 08/27/14 1412    Subjective Pt reported her legs are sore from planting bulbs yesterday.  She feels the medicine she is taking is quieting the pain a lot.    Currently in Pain? No/denies  stiffness in Lt groin and lateral Lt hip            [facility] - 08/27/14 0001    Assessment   Medical Diagnosis Lt hip pain   Onset Date 05/09/14   Next MD Visit 09/15/14   Strength   Strength Assessment Site Hip   Right/Left Hip Left   Left Hip Flexion 4/5  with pain    Left Hip Extension 5/5  with pain in SI   Left Hip ABduction 4+/5  with pain    Left Hip ADduction 4+/5                     [facility] Adult PT Treatment/Exercise - 08/27/14 0001    Exercises   Exercises Knee/Hip;Lumbar   Lumbar Exercises: Stretches   Lower Trunk Rotation 3 reps;20 seconds  one set with single knee   Hip Flexor Stretch 2 reps;30 seconds  Lt LLE only, prone   Lumbar Exercises: Supine   Ab Set 5  reps;5 seconds   Other Supine Lumbar Exercises Trans Abd with hip out/in x 5, with each leg, with marching, and heel slides     Knee/Hip Exercises: Aerobic   Stationary Bike NuStep L4 x 5 min                 PT Education - 08/27/14 1433    Education provided Yes   Education Details HEP- trans abd series added    Person(s) Educated Patient   Methods Explanation;Handout   Comprehension Verbalized understanding;Verbal cues required             PT Long Term Goals - 08/20/14 1611    PT LONG TERM GOAL #1   Title I with advanced HEP   Time 4   Period Weeks   Status New   PT LONG TERM GOAL #2   Title demo bilat hip strength =/> 5/5   Time 4   Period Weeks   Status New   PT LONG TERM GOAL #3   Title demo no more than 1/10 pain with palpation of Lt hip joint   Time 4  Period Weeks   Status New   PT LONG TERM GOAL #4   Title perform core work with good pelvic stability   Time 4   Period Weeks   Status New   PT LONG TERM GOAL #5   Title improve FOTO =/< 45% limited   Additional Long Term Goals   Additional Long Term Goals Yes   PT LONG TERM GOAL #6   Title demo Lt single knee to chest without increased pain   Time 4   Period Weeks   Status New               Plan - 08/27/14 1451    Clinical Impression Statement Pt continues to be point tender with palpation to Lt piriformis and ITB.  Pt tolerated all new exercises well without increase pain; required frequent VC for correct form.  Progressing towards goals.    Pt will benefit from skilled therapeutic intervention in order to improve on the following deficits Difficulty walking;Impaired flexibility;Pain;Decreased strength   Rehab Potential Good   PT Frequency 2x / week   PT Duration 4 weeks   PT Treatment/Interventions Moist Heat;Therapeutic activities;Patient/family education;Passive range of motion;Therapeutic exercise;Ultrasound;Manual techniques;Cryotherapy;Electrical Stimulation   PT Next Visit Plan  Continue core/ hip strengthening.  Trial dynamic/ktape to Lt hip (piriformis/ ITB) to decrease sensitivity and pain.    Consulted and Agree with Plan of Care Patient        Problem List Patient Active Problem List   Diagnosis Date Noted  . Left hip pain 02/23/2014  . Pulmonary nodule 09/19/2013  . Hyperlipidemia 09/12/2013  . Poison ivy dermatitis 09/12/2013  . Preventive measure 09/12/2013  . Osteoporosis 09/12/2013  . Smoker 09/12/2013   Mayer CamelJennifer [name], PTA 08/27/2014 5:49 PM  [facility] [address] [place] [address] [place], [place], [address] Phone: [phone]   Fax:  [phone]

## 2014-08-27 NOTE — Patient Instructions (Addendum)
  Abdominal Bracing With Pelvic Floor (Hook-Lying)   With neutral spine, tighten pelvic floor and abdominals. Hold 10 seconds. Repeat __10_ times. Do _1__ times a day.   Knee to Chest: Transverse Plane Stability   Bring one knee up, then return. Be sure pelvis does not roll side to side. Keep pelvis still. Lift knee __10_ times each leg. Restabilize pelvis. Repeat with other leg. Do _1-2__ sets, _1__ times per day.    Hip External Rotation With Pillow: Transverse Plane Stability   One knee bent, one leg straight, on pillow. Slowly roll bent knee out. Be sure pelvis does not rotate. Do _10__ times. Restabilize pelvis. Repeat with other leg. Do _1-2__ sets, _1__ times per day.  Heel Slide: 4-10 Inches - Transverse Plane Stability   Slide heel 4 inches down. Be sure pelvis does not rotate. Do _5__ times. (Work up towards 10 reps) Restabilize pelvis. Repeat with other leg. Do __1_ sets, _1__ times per day.  Lower Trunk Rotation Stretch   Keeping back flat and feet together, rotate knees to left side. Hold _10___ seconds. Repeat __5__ times each directiont. Do __1__ sets per session. Do _1-2___ sessions per day.  Hip Flexors (Supine)   Lie with both legs bent over edge of firm surface. To stretch left hip, bring opposite knee to chest.  Do not allow hips to roll up. Do not let knees change position. Hold __30__ seconds. Repeat _2___ times. Do _1___ sessions per day. CAUTION: Stretch should be gentle, steady and slow.  [facility] [address] [address] [place], [place] [address]  [phone] (office) [phone] (fax)

## 2014-08-31 ENCOUNTER — Ambulatory Visit (INDEPENDENT_AMBULATORY_CARE_PROVIDER_SITE_OTHER): Admitting: Physical Therapy

## 2014-08-31 DIAGNOSIS — M25552 Pain in left hip: Secondary | ICD-10-CM | POA: Diagnosis not present

## 2014-08-31 DIAGNOSIS — R531 Weakness: Secondary | ICD-10-CM | POA: Diagnosis not present

## 2014-08-31 DIAGNOSIS — M545 Low back pain: Secondary | ICD-10-CM | POA: Diagnosis not present

## 2014-08-31 NOTE — Patient Instructions (Signed)
  FUNCTIONAL MOBILITY: Squat With UE Support   Stand by chair or table. Stance: shoulder-width on floor. Bend hips and knees. Keep back straight. Do not allow knees to bend past toes. Squeeze glutes and quads to stand. _20__ reps per set, __1_ sets per day, _4-5__ days per week  Bridge   Lie back, legs bent. Inhale, pressing hips up. Keeping ribs in, lengthen lower back. Exhale, rolling down along spine from top. Repeat __10_ times- 2 sets  Do __1__ sessions per day.  Copyright  VHI. All rights reserved.    [facility] [address] [place] [place], [place] [address]  [phone] (office) [phone] (fax)

## 2014-08-31 NOTE — Therapy (Signed)
[facility] [address] [place], [place], [address] Phone: [phone]   Fax:  [phone]  Physical Therapy Treatment  Patient Details  Name: Ann BuffyJenny Maita MRN: [id number] Date of Birth: Sep 11, 1955 Referring Provider:  Monica Bectonhekkekandam, Thomas J,*  Encounter Date: 08/31/2014      PT End of Session - 08/31/14 1409    Visit Number 4   Number of Visits 8   Date for PT Re-Evaluation 09/17/14   PT Start Time 1407   PT Stop Time 1445   PT Time Calculation (min) 38 min      Past Medical History  Diagnosis Date  . Hyperlipemia   . Osteoporosis   . Degenerative disc disease     Past Surgical History  Procedure Laterality Date  . Tubal ligation    . Cystocele repair      There were no vitals filed for this visit.  Visit Diagnosis:  Left hip pain  Low back pain without sciatica, unspecified back pain laterality  Weakness generalized      Subjective Assessment - 08/31/14 1410    Subjective Pt states she feel she is getting better; experiencing less soreness in Lt hip.  Pt c/o pain in post Lt hip with bending over.    Currently in Pain? Yes   Pain Score 1    Pain Location Groin   Pain Orientation Left;Anterior   Pain Descriptors / Indicators Dull   Aggravating Factors  turning a certain way, stairs   Pain Relieving Factors medicine, rest             [name] - 08/31/14 0001    Assessment   Medical Diagnosis Lt hip pain   Onset Date 05/09/14   Next MD Visit 09/15/14                     [facility] Adult PT Treatment/Exercise - 08/31/14 0001    Exercises   Exercises Knee/Hip;Lumbar   Lumbar Exercises: Stretches   Single Knee to Chest Stretch 2 reps   Single Knee to Chest Stretch Limitations difficult /painful with LLE    Lumbar Exercises: Standing   Other Standing Lumbar Exercises Functional squat x 5 reps (demo for HEP); standing lummbar ext with 3 sec hold x 5 (for HEP)    Lumbar  Exercises: Supine   Ab Set 5 reps;5 seconds   Bridge 5 reps;10 reps  5 reps, then 10 with knee ext.    Other Supine Lumbar Exercises Trans Abd with hip out/in x 5, with each leg, with marching, and heel slides     Knee/Hip Exercises: Stretches   Passive Hamstring Stretch 2 reps;30 seconds   Hip Flexor Stretch 1 rep;60 seconds  supine, leg off table    ITB Stretch 2 reps;20 seconds   Knee/Hip Exercises: Aerobic   Stationary Bike NuStep L4: 4 min    Knee/Hip Exercises: Sidelying   Other Sidelying Knee Exercises sidelying quad stretch x 30 x 2   Manual Therapy   Manual Therapy Other (comment);Myofascial release   Myofascial Release to Lt piriformis / ITB (pt unable to tolerate much pressure)    Other Manual Therapy application of Ktape at Lt ant groin, Lt prox ITB, and Lt piriformis to decrease sensitivity and pain                 PT Education - 08/31/14 1655    Education provided Yes   Education Details HEP    Person(s) Educated  Patient   Methods Explanation;Handout   Comprehension Verbalized understanding;Returned demonstration             PT Long Term Goals - 08/20/14 1611    PT LONG TERM GOAL #1   Title I with advanced HEP   Time 4   Period Weeks   Status New   PT LONG TERM GOAL #2   Title demo bilat hip strength =/> 5/5   Time 4   Period Weeks   Status New   PT LONG TERM GOAL #3   Title demo no more than 1/10 pain with palpation of Lt hip joint   Time 4   Period Weeks   Status New   PT LONG TERM GOAL #4   Title perform core work with good pelvic stability   Time 4   Period Weeks   Status New   PT LONG TERM GOAL #5   Title improve FOTO =/< 45% limited   Additional Long Term Goals   Additional Long Term Goals Yes   PT LONG TERM GOAL #6   Title demo Lt single knee to chest without increased pain   Time 4   Period Weeks   Status New               Plan - 08/31/14 1652    Clinical Impression Statement Pt demonstrated improved activation  of core muscles. Pt reported pain in Lt low back and groin with Lt hip flexor stretch and single knee to chest. Pt continues to be point tender in Lt piriformis/psoas; trialed tape to decrease sensitivity.  Progressing towards goals.    Pt will benefit from skilled therapeutic intervention in order to improve on the following deficits Difficulty walking;Impaired flexibility;Pain;Decreased strength   Rehab Potential Good   PT Frequency 2x / week   PT Duration 4 weeks   PT Treatment/Interventions Moist Heat;Therapeutic activities;Patient/family education;Passive range of motion;Therapeutic exercise;Ultrasound;Manual techniques;Cryotherapy;Electrical Stimulation   PT Next Visit Plan Continue core/ hip strengthening.  Assess response ktape to Lt hip (piriformis/ ITB) and trial more manual work to Lt hip.    Consulted and Agree with Plan of Care Patient        Problem List Patient Active Problem List   Diagnosis Date Noted  . Left hip pain 02/23/2014  . Pulmonary nodule 09/19/2013  . Hyperlipidemia 09/12/2013  . Poison ivy dermatitis 09/12/2013  . Preventive measure 09/12/2013  . Osteoporosis 09/12/2013  . Smoker 09/12/2013   Mayer CamelJennifer Carlson-Long, PTA 08/31/2014 4:59 PM   [facility] [address] [place], [place], [address] Phone: [phone]   Fax:  [phone]

## 2014-09-03 ENCOUNTER — Ambulatory Visit (INDEPENDENT_AMBULATORY_CARE_PROVIDER_SITE_OTHER): Admitting: Physical Therapy

## 2014-09-03 DIAGNOSIS — M25552 Pain in left hip: Secondary | ICD-10-CM | POA: Diagnosis not present

## 2014-09-03 DIAGNOSIS — M545 Low back pain: Secondary | ICD-10-CM

## 2014-09-03 DIAGNOSIS — R531 Weakness: Secondary | ICD-10-CM | POA: Diagnosis not present

## 2014-09-03 NOTE — Therapy (Signed)
[facility] [address] [place] [place], [place], [address] Phone: [phone]   Fax:  [phone]  Physical Therapy Treatment  Patient Details  Name: Ann [name] MRN: [id number] Date of Birth: 1956/02/21 Referring Provider:  Monica Bectonhekkekandam, Thomas J,*  Encounter Date: 09/03/2014      PT End of Session - 09/03/14 1406    Visit Number 5   Number of Visits 8   Date for PT Re-Evaluation 09/17/14   PT Start Time 1403   PT Stop Time 1458   PT Time Calculation (min) 55 min   Activity Tolerance Patient limited by pain      Past Medical History  Diagnosis Date  . Hyperlipemia   . Osteoporosis   . Degenerative disc disease     Past Surgical History  Procedure Laterality Date  . Tubal ligation    . Cystocele repair      There were no vitals filed for this visit.  Visit Diagnosis:  Left hip pain  Low back pain without sciatica, unspecified back pain laterality  Weakness generalized      Subjective Assessment - 09/03/14 1406    Subjective "I'm miserable, I've gained 5lbs over two wks!" Pt reports she has stopped taking prednisone 2 days ago due to bloating and wt gain. Pt reports she is getting stronger.    Currently in Pain? No/denies  Only if in certain position, and up to 7/10 with manual therapy.             [facility] - 09/03/14 0001    Assessment   Medical Diagnosis Lt hip pain   Onset Date 05/09/14   Next MD Visit 09/15/14   Strength   Strength Assessment Site Hip   Right/Left Hip Left   Left Hip Flexion 4+/5   Left Hip Extension 5/5   Left Hip ABduction --  5-/5                     [facility] Adult PT Treatment/Exercise - 09/03/14 0001    Lumbar Exercises: Stretches   Passive Hamstring Stretch 2 reps;30 seconds  each side    Hip Flexor Stretch 30 seconds;2 reps  LLE leg hanging off side of table, ft pulled back    ITB Stretch 2 reps;30 seconds  each side    Lumbar Exercises:  Supine   Bridge 15 reps  with legs on green therapy ball    Other Supine Lumbar Exercises Green therapy ball hamstring curls x 10;    Other Supine Lumbar Exercises Lt SLR with hip abd/add x 5 reps (difficult) and painful)   Lumbar Exercises: Sidelying   Clam 20 reps  each side    Other Sidelying Lumbar Exercises mule kick x 15 each side.    Knee/Hip Exercises: Aerobic   Stationary Bike NuStep L4: 5 min    Cryotherapy   Number Minutes Cryotherapy --  Pt to perform at home   Manual Therapy   Manual Therapy Myofascial release;Other (comment);Manual Traction   Myofascial Release to Lt piriformis and glute med   Manual Traction Gentle Lt long leg traction  30 sec x 3 reps   Other Manual Therapy TPR to Lt psoas with contract relax, piriformis with contract relax. kinesiotape application to Lt ant groin and Lt piriformis to decrease sensitivity and pain.                      PT Long Term Goals - 08/20/14 1611  PT LONG TERM GOAL #1   Title I with advanced HEP   Time 4   Period Weeks   Status New   PT LONG TERM GOAL #2   Title demo bilat hip strength =/> 5/5   Time 4   Period Weeks   Status New   PT LONG TERM GOAL #3   Title demo no more than 1/10 pain with palpation of Lt hip joint   Time 4   Period Weeks   Status New   PT LONG TERM GOAL #4   Title perform core work with good pelvic stability   Time 4   Period Weeks   Status New   PT LONG TERM GOAL #5   Title improve FOTO =/< 45% limited   Additional Long Term Goals   Additional Long Term Goals Yes   PT LONG TERM GOAL #6   Title demo Lt single knee to chest without increased pain   Time 4   Period Weeks   Status New               Plan - 09/03/14 1413    Clinical Impression Statement Pt demonstrated improved Lt hip strength this visit. Continues with sensitivity to Lt piriformis/psoas, decreased slightly with use of kinesiotape. Pt able to tolerate increased reps this visit with ther ex.  Progressing towards goals.    Pt will benefit from skilled therapeutic intervention in order to improve on the following deficits Difficulty walking;Impaired flexibility;Pain;Decreased strength   Rehab Potential Good   PT Frequency 2x / week   PT Duration 4 weeks   PT Treatment/Interventions Moist Heat;Therapeutic activities;Patient/family education;Passive range of motion;Therapeutic exercise;Ultrasound;Manual techniques;Cryotherapy;Electrical Stimulation   PT Next Visit Plan Continue core/ hip strengthening. trial more manual work to Lt hip.    Consulted and Agree with Plan of Care Patient        Problem List Patient Active Problem List   Diagnosis Date Noted  . Left hip pain 02/23/2014  . Pulmonary nodule 09/19/2013  . Hyperlipidemia 09/12/2013  . Poison ivy dermatitis 09/12/2013  . Preventive measure 09/12/2013  . Osteoporosis 09/12/2013  . Smoker 09/12/2013    Mayer CamelJennifer Carlson-Long, PTA 09/03/2014 3:09 PM  [facility] [address] [address] [place], [place], [address] Phone: [phone]   Fax:  [phone]

## 2014-09-07 ENCOUNTER — Ambulatory Visit (INDEPENDENT_AMBULATORY_CARE_PROVIDER_SITE_OTHER): Admitting: Physical Therapy

## 2014-09-07 DIAGNOSIS — M25552 Pain in left hip: Secondary | ICD-10-CM | POA: Diagnosis not present

## 2014-09-07 DIAGNOSIS — R531 Weakness: Secondary | ICD-10-CM

## 2014-09-07 DIAGNOSIS — M545 Low back pain: Secondary | ICD-10-CM | POA: Diagnosis not present

## 2014-09-07 NOTE — Therapy (Signed)
[facility] [address] [place], [place], [address] Phone: [phone]   Fax:  [phone]  Physical Therapy Treatment  Patient Details  Name: Ann [name] MRN: [id number] Date of Birth: 30-Dec-1955 Referring Provider:  Monica Bectonhekkekandam, Thomas J,*  Encounter Date: 09/07/2014      PT End of Session - 09/07/14 1403    Visit Number 6   Number of Visits 8   Date for PT Re-Evaluation 09/17/14   PT Start Time 1402   PT Stop Time 1449   PT Time Calculation (min) 47 min      Past Medical History  Diagnosis Date  . Hyperlipemia   . Osteoporosis   . Degenerative disc disease     Past Surgical History  Procedure Laterality Date  . Tubal ligation    . Cystocele repair      There were no vitals filed for this visit.  Visit Diagnosis:  Left hip pain  Low back pain without sciatica, unspecified back pain laterality  Weakness generalized      Subjective Assessment - 09/07/14 1405    Subjective Pt reports she felt good when she left last session.  Pt hasn't had Prednisone since last Tues, feels it has contributed to increased Lt hip pain.  Wasn't able to sleep Thur, Friday, or Saturday night "couldn't get comfortable".     Currently in Pain? Yes   Pain Score 2 up to 6/10   Pain Location Groin   Pain Orientation Left   Pain Descriptors / Indicators Dull   Aggravating Factors  turning a certain way, stairs,    Pain Relieving Factors medicine, rest.             [facility] PT Assessment - 09/07/14 0001    Assessment   Medical Diagnosis Lt hip pain   Onset Date 05/09/14   Next MD Visit 09/15/14                     [facility] Adult PT Treatment/Exercise - 09/07/14 0001    Exercises   Exercises Knee/Hip;Lumbar   Lumbar Exercises: Stretches   Lower Trunk Rotation 10 seconds;3 reps  each side    Hip Flexor Stretch 1 rep;60 seconds  LLE   Lumbar Exercises: Supine   Bridge 8 reps  each side with unilateral knee  ext    Other Supine Lumbar Exercises Trans Abd with marching x 10 steps x 2 sets    Other Supine Lumbar Exercises Lt leg lengthener x 3 sec x 5 reps, each side. Leg press (hamstring isomet) x 3 sec x 5 reps    Knee/Hip Exercises: Aerobic   Stationary Bike NuStep L4: 4 min    Knee/Hip Exercises: Standing   Lateral Step Up Hand Hold: 1;Step Height: 6";Left;1 set;20 reps   Forward Step Up Left;10 reps;Step Height: 4";Hand Hold: 0;20 reps;1 set   Knee/Hip Exercises: Supine   Straight Leg Raises Left  stopped at 8 reps due to increased pain    Straight Leg Raise with External Rotation Limitations 1 rep, stopped due to increased pain   Other Supine Knee Exercises Lt hip flex then ABD/ADD with AAROM due to increased pain and difficulty x 5 reps.    Modalities   Modalities Cryotherapy;Electrical Stimulation   Cryotherapy   Number Minutes Cryotherapy 12 Minutes   Cryotherapy Location Hip   Type of Cryotherapy Ice pack   Electrical Stimulation   Electrical Stimulation Location Lt hip flexor and Lt piriformis   Electrical  Stimulation Action IFC   Electrical Stimulation Parameters 80/150 Hz x 12 min    Electrical Stimulation Goals Pain                     PT Long Term Goals - 08/20/14 1611    PT LONG TERM GOAL #1   Title I with advanced HEP   Time 4   Period Weeks   Status New   PT LONG TERM GOAL #2   Title demo bilat hip strength =/> 5/5   Time 4   Period Weeks   Status New   PT LONG TERM GOAL #3   Title demo no more than 1/10 pain with palpation of Lt hip joint   Time 4   Period Weeks   Status New   PT LONG TERM GOAL #4   Title perform core work with good pelvic stability   Time 4   Period Weeks   Status New   PT LONG TERM GOAL #5   Title improve FOTO =/< 45% limited   Additional Long Term Goals   Additional Long Term Goals Yes   PT LONG TERM GOAL #6   Title demo Lt single knee to chest without increased pain   Time 4   Period Weeks   Status New                Plan - 09/07/14 1423    Clinical Impression Statement Pt reporting worsened symptoms since discontinuing medicine. Pt difficulty tolerating hip flexion exercises on Lt. Minimal change with tape this visit; noted skin irritation where tape had been removed. Pt reported decrease in symptoms with use of estim and ice at end of treatment.    Pt will benefit from skilled therapeutic intervention in order to improve on the following deficits Difficulty walking;Impaired flexibility;Pain;Decreased strength   Rehab Potential Good   PT Frequency 2x / week   PT Duration 4 weeks   PT Treatment/Interventions Moist Heat;Therapeutic activities;Patient/family education;Passive range of motion;Therapeutic exercise;Ultrasound;Manual techniques;Cryotherapy;Electrical Stimulation   PT Next Visit Plan Continue core/ hip strengthening. Assess strength and goals; write MD note for upcoming appt.         Problem List Patient Active Problem List   Diagnosis Date Noted  . Left hip pain 02/23/2014  . Pulmonary nodule 09/19/2013  . Hyperlipidemia 09/12/2013  . Poison ivy dermatitis 09/12/2013  . Preventive measure 09/12/2013  . Osteoporosis 09/12/2013  . Smoker 09/12/2013   Mayer CamelJennifer Carlson-Long, PTA 09/07/2014 3:33 PM  [facility] [address] [place], [place], [address] Phone: [phone]   Fax:  [phone]

## 2014-09-10 ENCOUNTER — Ambulatory Visit (INDEPENDENT_AMBULATORY_CARE_PROVIDER_SITE_OTHER): Admitting: Physical Therapy

## 2014-09-10 DIAGNOSIS — R531 Weakness: Secondary | ICD-10-CM

## 2014-09-10 DIAGNOSIS — M25552 Pain in left hip: Secondary | ICD-10-CM | POA: Diagnosis not present

## 2014-09-10 DIAGNOSIS — M545 Low back pain: Secondary | ICD-10-CM

## 2014-09-10 NOTE — Therapy (Signed)
[place], [place], [address] Phone: [phone]   Fax:  [phone]  Physical Therapy Treatment  Patient Details  Name: Ann [name] MRN: [id number] Date of Birth: 1956-03-13 Referring Provider:  Silverio Decamp,*  Encounter Date: 09/10/2014      PT End of Session - 09/10/14 1407    Visit Number 7   Number of Visits 8   Date for PT Re-Evaluation 09/17/14   PT Start Time 7564   PT Stop Time 1503   PT Time Calculation (min) 58 min      Past Medical History  Diagnosis Date  . Hyperlipemia   . Osteoporosis   . Degenerative disc disease     Past Surgical History  Procedure Laterality Date  . Tubal ligation    . Cystocele repair      There were no vitals filed for this visit.  Visit Diagnosis:  Left hip pain  Low back pain without sciatica, unspecified back pain laterality  Weakness generalized      Subjective Assessment - 09/10/14 1407    Subjective Pt reports she is continuing to have "stomach issues" since stopping prednisone, (stomach ache after eating anything). Lt hip still bothers her when walking    Patient Stated Goals to be able to move around like before (bike riding), reduce pain    Currently in Pain? Yes   Pain Score 1   up to 5/10 with Lt hip flexion for walking and SLR   Pain Location Groin   Pain Orientation Left   Pain Descriptors / Indicators Dull            [facility] PT Assessment - 09/10/14 0001    Assessment   Medical Diagnosis Lt hip pain   Onset Date 05/09/14   Next MD Visit 09/15/14   Observation/Other Assessments   Focus on Therapeutic Outcomes (FOTO)  FOTO: 46% limited (goal 45%)   ROM / Strength   AROM / PROM / Strength AROM;Strength   AROM   AROM Assessment Site Hip   Right/Left Hip Right;Left   Right Hip Flexion 130   Left Hip Flexion 105   Strength   Strength Assessment Site Hip   Right/Left Hip Left   Left Hip Flexion --  5-/5,  with pain    Left Hip Extension 5/5   Left Hip ABduction --  5-/5   Palpation   Palpation Pt point tender in Lt TFL and ITB (pain up to 5/10) , and in Lt piriformis                     [facility] Adult PT Treatment/Exercise - 09/10/14 0001    Lumbar Exercises: Supine   Other Supine Lumbar Exercises Trans Abd with hip in/out x 10 reps,  marching x 10 steps x 2 sets    Lumbar Exercises: Quadruped   [place] Horse 5 reps   Straight Leg Raise 10 reps   Opposite Arm/Leg Raise Right arm/Left leg;Left arm/Right leg;10 reps  (challenge)   Knee/Hip Exercises: Stretches   Passive Hamstring Stretch 20 seconds;2 reps  each leg    Quad Stretch 2 reps;30 seconds  Rt/Lt leg    Hip Flexor Stretch 2 reps;20 seconds   ITB Stretch 2 reps;20 seconds   Piriformis Stretch 2 reps;30 seconds  each side   Knee/Hip Exercises: Aerobic   Stationary Bike NuStep L4: 4 min    Knee/Hip Exercises: Supine   Bridges 10 reps;2 sets  with  unilateral knee ext    Straight Leg Raise with External Rotation 1 set;5 reps;Left;Strengthening   Straight Leg Raise with External Rotation Limitations some pain in Lt groin with this.    Modalities   Modalities Cryotherapy;Electrical Stimulation   Cryotherapy   Number Minutes Cryotherapy 15 Minutes   Cryotherapy Location Hip   Type of Cryotherapy Ice pack   Electrical Stimulation   Electrical Stimulation Location Lt hip flexor and Lt piriformis   Electrical Stimulation Action IFC   Electrical Stimulation Parameters 80/150 hz x 15 min    Electrical Stimulation Goals Pain                PT Education - 09/10/14 1449    Education provided Yes   Education Details HEP - added quadruped arm/leg ext    Person(s) Educated Patient   Methods Explanation;Handout   Comprehension Verbalized understanding;Returned demonstration             PT Long Term Goals - 09/10/14 1451    PT LONG TERM GOAL #1   Title I with advanced HEP   Time 4   Period Weeks    Status On-going   PT LONG TERM GOAL #2   Title demo bilat hip strength =/> 5/5   Time 4   Period Weeks   Status Partially Met   PT LONG TERM GOAL #3   Title demo no more than 1/10 pain with palpation of Lt hip joint   Time 4   Period Weeks   Status On-going  Up to 5/10 (reported 09/10/14)   PT LONG TERM GOAL #4   Title perform core work with good pelvic stability   Time 4   Period Weeks   Status Achieved   PT LONG TERM GOAL #5   Title improve FOTO =/< 45% limited   Time 4   Period Weeks   Status On-going  scored 46% on 09/10/14.    PT LONG TERM GOAL #6   Title demo Lt single knee to chest without increased pain   Time 4   Status On-going               Plan - 09/10/14 1513    Clinical Impression Statement Pt demonstrated improved Lt hip strength.  Continues to be point tender with palpation to Lt hip and  demo decreased hip flexion in supine secondary to pain (knee to chest). Pt has improved core strength and is progressing towards goals. Pt interested in continuation of therapy to decrease pain, increase hip ROM, and improve functional mobility.    Pt will benefit from skilled therapeutic intervention in order to improve on the following deficits Difficulty walking;Impaired flexibility;Pain;Decreased strength   Rehab Potential Good   PT Treatment/Interventions Moist Heat;Therapeutic activities;Patient/family education;Passive range of motion;Therapeutic exercise;Ultrasound;Manual techniques;Cryotherapy;Electrical Stimulation   PT Next Visit Plan Spoke to supervising PT regarding pt progress and her desire to cont therapy.    Consulted and Agree with Plan of Care Patient        Problem List Patient Active Problem List   Diagnosis Date Noted  . Left hip pain 02/23/2014  . Pulmonary nodule 09/19/2013  . Hyperlipidemia 09/12/2013  . Poison ivy dermatitis 09/12/2013  . Preventive measure 09/12/2013  . Osteoporosis 09/12/2013  . Smoker 09/12/2013   Kerin Perna, PTA 09/10/2014 3:23 PM  [place], [place], [address] Phone: [phone]   Fax:  [phone]

## 2014-09-10 NOTE — Patient Instructions (Signed)
Healthy Back Strengthening - Back Extension on All Fours   Start on hands and knees, keeping them apart. Straighten right leg and left arm at the same time. Hold __1-3__ seconds. Switch immediately and repeat with left leg and right arm. Do _5___ times. Increase repetitions gradually up to _10___. Increase each hold gradually up to __5__ seconds.  Self massage to Left hip 3-5 min, 1-2 times per day.   [facility] [address] [place], [place] [address]  [phone] (office) [phone] (fax)

## 2014-09-15 ENCOUNTER — Encounter: Payer: Self-pay | Admitting: Sports Medicine

## 2014-09-15 ENCOUNTER — Ambulatory Visit (INDEPENDENT_AMBULATORY_CARE_PROVIDER_SITE_OTHER): Admitting: Sports Medicine

## 2014-09-15 VITALS — BP 125/82 | HR 101 | Ht 63.0 in | Wt 148.0 lb

## 2014-09-15 DIAGNOSIS — E785 Hyperlipidemia, unspecified: Secondary | ICD-10-CM | POA: Diagnosis not present

## 2014-09-15 DIAGNOSIS — M25552 Pain in left hip: Secondary | ICD-10-CM | POA: Diagnosis not present

## 2014-09-15 DIAGNOSIS — S86892A Other injury of other muscle(s) and tendon(s) at lower leg level, left leg, initial encounter: Secondary | ICD-10-CM

## 2014-09-15 DIAGNOSIS — S86899A Other injury of other muscle(s) and tendon(s) at lower leg level, unspecified leg, initial encounter: Secondary | ICD-10-CM | POA: Insufficient documentation

## 2014-09-15 MED ORDER — MELOXICAM 15 MG PO TABS: ORAL_TABLET | ORAL | Status: DC

## 2014-09-15 MED ORDER — ACETAMINOPHEN ER 650 MG PO TBCR: 650.0000 mg | EXTENDED_RELEASE_TABLET | Freq: Three times a day (TID) | ORAL | Status: DC | PRN

## 2014-09-15 MED ORDER — SIMVASTATIN 40 MG PO TABS: 40.0000 mg | ORAL_TABLET | Freq: Every evening | ORAL | Status: DC

## 2014-09-15 NOTE — Assessment & Plan Note (Addendum)
Trochanteric bursa pain has resolved. Now having some pain referable to the femoral acetabular joint. Adding meloxicam, and arthritis strength Tylenol as needed. Renewing physical therapy referral Return in a month if no better for femoral acetabular injection.

## 2014-09-15 NOTE — Progress Notes (Signed)
  Subjective:    CC: Follow-up  HPI: Left hip pain: Trochanteric bursa symptoms have since resolved, unfortunately continues to have some pain that she localizes in the groin. Moderate, persistent without radiation, has been doing extremely well with physical therapy.  Past medical history, Surgical history, Family history not pertinant except as noted below, Social history, Allergies, and medications have been entered into the medical record, reviewed, and no changes needed.   Review of Systems: No fevers, chills, night sweats, weight loss, chest pain, or shortness of breath.   Objective:    General: Well Developed, well nourished, and in no acute distress.  Neuro: Alert and oriented x3, extra-ocular muscles intact, sensation grossly intact.  HEENT: Normocephalic, atraumatic, pupils equal round reactive to light, neck supple, no masses, no lymphadenopathy, thyroid nonpalpable.  Skin: Warm and dry, no rashes. Cardiac: Regular rate and rhythm, no murmurs rubs or gallops, no lower extremity edema.  Respiratory: Clear to auscultation bilaterally. Not using accessory muscles, speaking in full sentences.  Left hip: Pain with internal rotation localized in the groin.  Impression and Recommendations:

## 2014-09-15 NOTE — Assessment & Plan Note (Addendum)
Refilling simvastatin, rechecking lipids. Persistent triglyceride elevation, adding niacin, recheck fasting lipids in 3 months

## 2014-09-15 NOTE — Assessment & Plan Note (Signed)
Return for custom orthotics 

## 2014-09-17 ENCOUNTER — Other Ambulatory Visit: Payer: Self-pay | Admitting: Sports Medicine

## 2014-09-17 ENCOUNTER — Ambulatory Visit (INDEPENDENT_AMBULATORY_CARE_PROVIDER_SITE_OTHER): Admitting: Physical Therapy

## 2014-09-17 DIAGNOSIS — M25552 Pain in left hip: Secondary | ICD-10-CM

## 2014-09-17 DIAGNOSIS — R531 Weakness: Secondary | ICD-10-CM

## 2014-09-17 DIAGNOSIS — M545 Low back pain: Secondary | ICD-10-CM | POA: Diagnosis not present

## 2014-09-17 NOTE — Therapy (Signed)
[place], [place], [address] Phone: [phone]   Fax:  [phone]  Physical Therapy Treatment  Patient Details  Name: Ann [name] MRN: [id number] Date of Birth: 07-22-1955 Referring Provider:  Silverio Decamp,*  Encounter Date: 09/17/2014      PT End of Session - 09/17/14 1454    Visit Number 8   Number of Visits 15   Date for PT Re-Evaluation 10/08/14   PT Start Time 0034   PT Stop Time 1543   PT Time Calculation (min) 56 min   Equipment Utilized During Treatment --   Activity Tolerance Patient tolerated treatment well      Past Medical History  Diagnosis Date  . Hyperlipemia   . Osteoporosis   . Degenerative disc disease     Past Surgical History  Procedure Laterality Date  . Tubal ligation    . Cystocele repair      There were no vitals filed for this visit.  Visit Diagnosis:  Left hip pain  Low back pain without sciatica, unspecified back pain laterality  Weakness generalized      Subjective Assessment - 09/17/14 1453    Currently in Pain? No/denies.  Pt did report some pain during therapeutic exercise.             [facility] PT Assessment - 09/17/14 0001    Assessment   Medical Diagnosis Lt hip pain   Onset Date/Surgical Date 05/09/14   Next MD Visit PRN   AROM   Left Hip Flexion 100  with hands assisting LE  (Rt hip 130 degrees)                     [facility] Adult PT Treatment/Exercise - 09/17/14 0001    Lumbar Exercises: Stretches   Hip Flexor Stretch 2 reps;60 seconds   Lumbar Exercises: Supine   Bridge --  8 reps each side with unilateral knee ext    Knee/Hip Exercises: Stretches   Press photographer 20 seconds;2 reps   Soleus Stretch 2 reps;20 seconds   Knee/Hip Exercises: Aerobic   Stationary Bike NuStep L4: 5 min    Knee/Hip Exercises: Standing   Heel Raises 2 sets;10 reps   Lateral Step Up Step Height: 6";Hand Hold: 2  cross over  step ups   Lateral Step Up Limitations some hip pain    Forward Step Up Step Height: 8";Hand Hold: 1;2 sets;20 reps;Left   Knee/Hip Exercises: Supine   Other Supine Knee Exercises Lt hip ext to flexion x 10 (leg off table to up on to table)    Cryotherapy   Number Minutes Cryotherapy 12 Minutes   Cryotherapy Location Hip   Type of Cryotherapy Ice pack  ant groin with leg adducted.    Manual Therapy   Manual Therapy Joint mobilization;Passive ROM;Myofascial release   Joint Mobilization Lt hip distraction, and Grade II and III AP great troch mobs    Myofascial Release Lt adductors (very tender! and tight)   Passive ROM Lt hip into ER, sustained and oscilations   Manual Traction Gentle Lt long leg traction  30 sec x 5 reps                     PT Long Term Goals - 09/10/14 1451    PT LONG TERM GOAL #1   Title I with advanced HEP   Time 4   Period Weeks   Status On-going   PT LONG  TERM GOAL #2   Title demo bilat hip strength =/> 5/5   Time 4   Period Weeks   Status Partially Met   PT LONG TERM GOAL #3   Title demo no more than 1/10 pain with palpation of Lt hip joint   Time 4   Period Weeks   Status On-going  Up to 5/10 (reported 09/10/14)   PT LONG TERM GOAL #4   Title perform core work with good pelvic stability   Time 4   Period Weeks   Status Achieved   PT LONG TERM GOAL #5   Title improve FOTO =/< 45% limited   Time 4   Period Weeks   Status On-going  scored 46% on 09/10/14.    PT LONG TERM GOAL #6   Title demo Lt single knee to chest without increased pain   Time 4   Status On-going               Plan - 09/17/14 1653    Clinical Impression Statement Pt very point tender in Lt adductors and also reports some pain with Lt hip ER in supine. Pt continues with decreased Lt hip flexion ROM and has notable tightness in Lt hip adductors. Pt was a little guarded with manual therapy initially, reported some hip relief once relaxed.    Pt will benefit  from skilled therapeutic intervention in order to improve on the following deficits Difficulty walking;Impaired flexibility;Pain;Decreased strength   Rehab Potential Good   PT Frequency 2x / week   PT Duration 4 weeks   PT Treatment/Interventions Moist Heat;Therapeutic activities;Patient/family education;Passive range of motion;Therapeutic exercise;Ultrasound;Manual techniques;Cryotherapy;Electrical Stimulation   PT Next Visit Plan Continue manual therapy to Lt hip and STW to adductors, continue strengthening, modalities PRN.    Consulted and Agree with Plan of Care Patient        Problem List Patient Active Problem List   Diagnosis Date Noted  . Medial tibial stress syndrome 09/15/2014  . Left hip pain 02/23/2014  . Pulmonary nodule 09/19/2013  . Hyperlipidemia 09/12/2013  . Poison ivy dermatitis 09/12/2013  . Preventive measure 09/12/2013  . Osteoporosis 09/12/2013  . Smoker 09/12/2013    Kerin Perna, PTA 09/17/2014 4:59 PM  [facility] [place], [place], [address] Phone: [phone]   Fax:  [phone]

## 2014-09-24 ENCOUNTER — Encounter: Admitting: Physical Therapy

## 2014-09-28 ENCOUNTER — Encounter: Admitting: Physical Therapy

## 2014-09-29 ENCOUNTER — Encounter: Admitting: Sports Medicine

## 2014-10-01 ENCOUNTER — Ambulatory Visit (INDEPENDENT_AMBULATORY_CARE_PROVIDER_SITE_OTHER): Admitting: Physical Therapy

## 2014-10-01 DIAGNOSIS — M25552 Pain in left hip: Secondary | ICD-10-CM | POA: Diagnosis not present

## 2014-10-01 DIAGNOSIS — R531 Weakness: Secondary | ICD-10-CM | POA: Diagnosis not present

## 2014-10-01 NOTE — Therapy (Signed)
[place], [place], [address] Phone: [phone]   Fax:  [phone]  Physical Therapy Treatment  Patient Details  Name: Ann [name] MRN: [id number] Date of Birth: December 13, 1955 Referring Provider:  Silverio Decamp,*  Encounter Date: 10/01/2014      PT End of Session - 10/01/14 1458    Visit Number 9   Number of Visits 15   Date for PT Re-Evaluation 10/08/14   PT Start Time 6237   PT Stop Time 1556   PT Time Calculation (min) 62 min      Past Medical History  Diagnosis Date  . Hyperlipemia   . Osteoporosis   . Degenerative disc disease     Past Surgical History  Procedure Laterality Date  . Tubal ligation    . Cystocele repair      There were no vitals filed for this visit.  Visit Diagnosis:  Left hip pain  Weakness generalized      Subjective Assessment - 10/01/14 1455    Subjective Today hasn't been too bad because she has been sitting alot.    Pertinent History taking mobic now PRN and it seems to help   Patient Stated Goals to be able to move around like before (bike riding), reduce pain    Currently in Pain? Yes   Pain Score 3    Pain Location Hip   Pain Orientation Left   Pain Descriptors / Indicators Aching;Dull   Pain Type Chronic pain   Pain Onset More than a month ago   Aggravating Factors  walking fast   Pain Relieving Factors rest and medicine            [facility] PT Assessment - 10/01/14 0001    Assessment   Medical Diagnosis Lt hip pain   Onset Date/Surgical Date 05/09/14   Strength   Left Hip Flexion 5/5  with pain   Left Hip Extension 5/5   Left Hip ABduction 5/5                     [facility] Adult PT Treatment/Exercise - 10/01/14 0001    Lumbar Exercises: Aerobic   Stationary Bike Nustep L5 x 6'   Lumbar Exercises: Supine   Ab Set 15 reps  with knees in/out   Other Supine Lumbar Exercises 15 reps leg press with 5 sec holds   Lumbar  Exercises: Prone   Straight Leg Raise --  3x10 with flexed knee. with pelvic press.    Knee/Hip Exercises: Sidelying   Hip ADduction Strengthening;Left;3 sets;10 reps  with Rt LE elevatied on a chair   Modalities   Modalities Moist Heat   Moist Heat Therapy   Number Minutes Moist Heat 15 Minutes   Moist Heat Location --  Lt groin   Manual Therapy   Manual Therapy Joint mobilization;Soft tissue mobilization   Joint Mobilization Lt hip distraction, PA mobs grade III, stretching into IR and knee to chest   Manual Traction Lt LE with towel around ankle                     PT Long Term Goals - 10/01/14 1503    PT LONG TERM GOAL #1   Title I with advanced HEP   Status On-going   PT LONG TERM GOAL #2   Title demo bilat hip strength =/> 5/5   Status Achieved   PT LONG TERM GOAL #3   Title demo no  more than 1/10 pain with palpation of Lt hip joint   Status On-going   PT LONG TERM GOAL #4   Title perform core work with good pelvic stability   Status Achieved   PT LONG TERM GOAL #5   Title improve FOTO =/< 45% limited   Status On-going   PT LONG TERM GOAL #6   Title demo Lt single knee to chest without increased pain   Status On-going               Plan - 10/01/14 1545    Clinical Impression Statement Pt responded well to manual therapy with decreased Lt hip pain and increased motion.  Has met her strength goal and progressing to the others.     Pt will benefit from skilled therapeutic intervention in order to improve on the following deficits Difficulty walking;Impaired flexibility;Pain;Decreased strength   Rehab Potential Good   PT Frequency 2x / week   PT Duration 4 weeks   PT Treatment/Interventions Moist Heat;Therapeutic activities;Patient/family education;Passive range of motion;Therapeutic exercise;Ultrasound;Manual techniques;Cryotherapy;Electrical Stimulation   PT Next Visit Plan core ther ex and manual therapy to increase Lt hip ROM    Consulted and  Agree with Plan of Care Patient        Problem List Patient Active Problem List   Diagnosis Date Noted  . Medial tibial stress syndrome 09/15/2014  . Left hip pain 02/23/2014  . Pulmonary nodule 09/19/2013  . Hyperlipidemia 09/12/2013  . Poison ivy dermatitis 09/12/2013  . Preventive measure 09/12/2013  . Osteoporosis 09/12/2013  . Smoker 09/12/2013    Jeral Pinch, PT 10/01/2014, 3:47 PM  [facility] [place], [place], [address] Phone: [phone]   Fax:  [phone]

## 2014-10-05 ENCOUNTER — Encounter: Admitting: Physical Therapy

## 2014-10-08 ENCOUNTER — Encounter: Payer: Self-pay | Admitting: Rehabilitative and Restorative Service Providers"

## 2014-10-08 ENCOUNTER — Ambulatory Visit (INDEPENDENT_AMBULATORY_CARE_PROVIDER_SITE_OTHER): Admitting: Rehabilitative and Restorative Service Providers"

## 2014-10-08 DIAGNOSIS — M545 Low back pain: Secondary | ICD-10-CM | POA: Diagnosis not present

## 2014-10-08 DIAGNOSIS — M25552 Pain in left hip: Secondary | ICD-10-CM

## 2014-10-08 DIAGNOSIS — R531 Weakness: Secondary | ICD-10-CM | POA: Diagnosis not present

## 2014-10-08 NOTE — Patient Instructions (Signed)
Instructed in myofacial bal release to work in supine, prone and standing with varied size balls

## 2014-10-08 NOTE — Therapy (Signed)
[place], [place], [address] Phone: [phone]   Fax:  [phone]  Physical Therapy Treatment  Patient Details  Name: Ann [name] MRN: [id number] Date of Birth: 1956-03-25 Referring Provider:  Silverio Decamp,*  Encounter Date: 10/08/2014      PT End of Session - 10/08/14 1555    Visit Number 10   Number of Visits 15   Date for PT Re-Evaluation 11/12/14      Past Medical History  Diagnosis Date  . Hyperlipemia   . Osteoporosis   . Degenerative disc disease     Past Surgical History  Procedure Laterality Date  . Tubal ligation    . Cystocele repair      There were no vitals filed for this visit.  Visit Diagnosis:  Left hip pain  Weakness generalized  Low back pain without sciatica, unspecified back pain laterality      Subjective Assessment - 10/08/14 1456    Subjective Lt  hip is much worse today. She was cleaning up the floor on hands and knees.    Pertinent History taking mobic now PRN and it seems to help   How long can you sit comfortably? no problem   How long can you stand comfortably? 1 hour   How long can you walk comfortably? from 5 to 10 minutes   Patient Stated Goals to be able to move around like before (bike riding), reduce pain    Currently in Pain? Yes   Pain Score 2    Pain Location Hip   Pain Orientation Left   Pain Descriptors / Indicators Aching;Dull   Pain Type Chronic pain   Pain Onset More than a month ago   Aggravating Factors  walking; increased activity such as cleaning   Pain Relieving Factors rest and medication - took Rx meds today which does help   Multiple Pain Sites No            [facility] PT Assessment - 10/08/14 0001    Assessment   Medical Diagnosis Lt hip pain   Onset Date/Surgical Date 05/09/14   Observation/Other Assessments   Focus on Therapeutic Outcomes (FOTO)  53% limittion   Strength   Left Hip Flexion 5/5  with  pain   Left Hip Extension 5/5   Left Hip ABduction 5/5         [facility] Adult PT Treatment/Exercise - 10/08/14 0001    Lumbar Exercises: Stretches   Piriformis Stretch Limitations trial of piriformis stretch created anterior hip pain did not add   Lumbar Exercises: Aerobic   Stationary Bike Nustep L4; 8 min   Lumbar Exercises: Supine   Ab Set 15 reps  with knees in/out   Other Supine Lumbar Exercises 15 reps leg press with 5 sec holds   Knee/Hip Exercises: Supine   Hip Adduction Isometric 2 sets;10 reps  ball squeeze, hooklying   Modalities   Modalities Moist Heat   Moist Heat Therapy   Number Minutes Moist Heat 10 Minutes   Moist Heat Location Hip  and groin   Manual Therapy   Manual Therapy Joint mobilization;Soft tissue mobilization   Joint Mobilization Hip distraction through extended leg, AP in hip flexion with strap, laterally with strap   Soft tissue mobilization through piriformis, hip abductors,patient in prone   Other Manual Therapy added myofacial ball release work with patient in supine, prone, standing working through piriformis/hip abductors/hip flexors  PT Education - 10/08/14 1554    Education provided Yes   Education Details HEP added myofacial release work    Forensic psychologist) Educated Patient   Methods Explanation;Demonstration;Tactile cues;Verbal cues   Comprehension Verbalized understanding;Returned demonstration;Verbal cues required;Tactile cues required            PT Long Term Goals - 10/08/14 1744    PT LONG TERM GOAL #1   Title I with advanced HEP   Time 4   Period Weeks   Status Partially Met   PT LONG TERM GOAL #2   Title demo bilat hip strength =/> 5/5   Period Weeks   Status Achieved   PT LONG TERM GOAL #3   Title demo no more than 1/10 pain with palpation of Lt hip joint   Time 5   Period Weeks   PT LONG TERM GOAL #4   Title perform core work with good pelvic stability   Time 4   Period Weeks   Status Achieved   PT LONG  TERM GOAL #5   Title improve FOTO =/< 45% limited   Time 5   Period Weeks   Status On-going   PT LONG TERM GOAL #6   Title demo Lt single knee to chest without increased pain   Time 5   Period Weeks   Status On-going             Plan - 10/08/14 1738    Clinical Impression Statement Patient has responded well to PT but continues to have flare up of symptoms related to activity level. Strength is 5/5 hip strength. She continues to have limited hip mobility; pain with movement and functional activities; muscular tightness; functional activitiy limitations   Pt will benefit from skilled therapeutic intervention in order to improve on the following deficits Difficulty walking;Impaired flexibility;Pain;Decreased strength   Rehab Potential Good   PT Frequency 1x / week   PT Duration Other (comment)  5 weeks   PT Treatment/Interventions Moist Heat;Therapeutic activities;Patient/family education;Passive range of motion;Therapeutic exercise;Ultrasound;Manual techniques;Cryotherapy;Electrical Stimulation   PT Next Visit Plan evaluate for tolerance of piriformis stretch; continue with hip mobilization and soft tissue work; core stabilizatioin and hip exercises as indicated   PT Home Exercise Plan Add myofacial ball release work in supine, prone and standing   Consulted and Agree with Plan of Care Patient        Problem List Patient Active Problem List   Diagnosis Date Noted  . Medial tibial stress syndrome 09/15/2014  . Left hip pain 02/23/2014  . Pulmonary nodule 09/19/2013  . Hyperlipidemia 09/12/2013  . Poison ivy dermatitis 09/12/2013  . Preventive measure 09/12/2013  . Osteoporosis 09/12/2013  . Smoker 09/12/2013    Adaja Wander Nilda Simmer, PT, MPH 10/08/2014, 5:52 PM  [facility] [place], [place], [address] Phone: [phone]   Fax:  [phone]

## 2014-10-12 ENCOUNTER — Ambulatory Visit (INDEPENDENT_AMBULATORY_CARE_PROVIDER_SITE_OTHER): Admitting: Physical Therapy

## 2014-10-12 DIAGNOSIS — R531 Weakness: Secondary | ICD-10-CM | POA: Diagnosis not present

## 2014-10-12 DIAGNOSIS — M545 Low back pain: Secondary | ICD-10-CM | POA: Diagnosis not present

## 2014-10-12 DIAGNOSIS — M25552 Pain in left hip: Secondary | ICD-10-CM

## 2014-10-13 NOTE — Therapy (Addendum)
[place], [place], [address] Phone: [phone]   Fax:  [phone]  Physical Therapy Treatment  Patient Details  Name: Ann [name] MRN: [id number] Date of Birth: October 11, 1955 Referring Provider:  Silverio Decamp,*  Encounter Date: 10/12/2014      PT End of Session - 10/12/14 1437    Visit Number 11   Number of Visits 15   Date for PT Re-Evaluation 11/12/14   PT Start Time 3419   PT Stop Time 1534   PT Time Calculation (min) 59 min      Past Medical History  Diagnosis Date  . Hyperlipemia   . Osteoporosis   . Degenerative disc disease     Past Surgical History  Procedure Laterality Date  . Tubal ligation    . Cystocele repair      There were no vitals filed for this visit.  Visit Diagnosis:  Left hip pain  Weakness generalized  Low back pain without sciatica, unspecified back pain laterality      Subjective Assessment - 10/12/14 1437    Subjective Pt presents with antalgic gait.  "I got stuck getting out of the car today" (Difficulty lifting Lt hip to get out of car).  She notes getting out of bed in morning easier; not as stiff.    Currently in Pain? Yes   Pain Score 2   took pain medication 2 hrs prior to therapy.    Pain Location Hip   Pain Orientation Left   Pain Descriptors / Indicators Sharp   Aggravating Factors  prolonged standing; cleaning   Pain Relieving Factors rest, medication             [facility] PT Assessment - 10/13/14 0001    Assessment   Next MD Visit PRN                     [facility] Adult PT Treatment/Exercise - 10/12/14 0001    Lumbar Exercises: Aerobic   Stationary Bike NuStep L4: 5 min    Knee/Hip Exercises: Supine   Bridges Strengthening  5 sec hold in ext (BLE) x 10; then unilateral knee ext x 10    Straight Leg Raises Strengthening;Left;1 set  8 reps, painful   Straight Leg Raise with External Rotation --  1 rep; painful-  stopped   Straight Leg Raise with External Rotation Limitations pain in Lt groin   Modalities   Modalities Moist Heat   Moist Heat Therapy   Number Minutes Moist Heat 12 Minutes   Moist Heat Location Hip   Manual Therapy   Manual Therapy Joint mobilization;Muscle Energy Technique   Joint Mobilization Hip distraction through extended leg, AP in hip flexion with strap, laterally with strap   Soft tissue mobilization through piriformis, hip abductors,patient in prone and sidelying.  Cross fiber friction to Lt ITB very point tender.    Muscle Energy Technique for Lt piriformis and psoas                      PT Long Term Goals - 10/08/14 1744    PT LONG TERM GOAL #1   Title I with advanced HEP   Time 4   Period Weeks   Status Partially Met   PT LONG TERM GOAL #2   Title demo bilat hip strength =/> 5/5   Period Weeks   Status Achieved   PT LONG TERM GOAL #3   Title demo no  more than 1/10 pain with palpation of Lt hip joint   Time 5   Period Weeks   PT LONG TERM GOAL #4   Title perform core work with good pelvic stability   Time 4   Period Weeks   Status Achieved   PT LONG TERM GOAL #5   Title improve FOTO =/< 45% limited   Time 5   Period Weeks   Status On-going   PT LONG TERM GOAL #6   Title demo Lt single knee to chest without increased pain   Time 5   Period Weeks   Status On-going               Plan - 10/13/14 1126    Clinical Impression Statement Lt hip ROM continues to be limited due to pain.  Pt very point tender in Lt TFL, ITB, and piriformis; still unable to tolerate piriformis stretch. Pt reported hip pain was elimiated with long leg traction, slowly returned when leg returned. Pt reported decrease pain and stiffness with use of MHP as well.     Pt will benefit from skilled therapeutic intervention in order to improve on the following deficits Difficulty walking;Impaired flexibility;Pain;Decreased strength   Rehab Potential Good   PT  Frequency 1x / week   PT Duration --  5 wks    PT Treatment/Interventions Moist Heat;Therapeutic activities;Patient/family education;Passive range of motion;Therapeutic exercise;Ultrasound;Manual techniques;Cryotherapy;Electrical Stimulation   PT Next Visit Plan Continue with hip mobilization and soft tissue work; core stabilizatioin and hip exercises as indicated   Consulted and Agree with Plan of Care Patient        Problem List Patient Active Problem List   Diagnosis Date Noted  . Medial tibial stress syndrome 09/15/2014  . Left hip pain 02/23/2014  . Pulmonary nodule 09/19/2013  . Hyperlipidemia 09/12/2013  . Poison ivy dermatitis 09/12/2013  . Preventive measure 09/12/2013  . Osteoporosis 09/12/2013  . Smoker 09/12/2013    Shelbie Hutching 10/13/2014, 1:02 PM  [facility] [place], [place], [address] Phone: [phone]   Fax:  [phone]    PHYSICAL THERAPY DISCHARGE SUMMARY  Visits from Start of Care: 11  Current functional level related to goals / functional outcomes: unknown   Remaining deficits: unknown   Education / Equipment: HEP Plan:                                                    Patient goals were partially met. Patient is being discharged due to not returning since the last visit.  ?????    Jeral Pinch, PT 11/12/2014 2:30 PM

## 2014-10-15 ENCOUNTER — Encounter: Admitting: Physical Therapy

## 2014-10-21 LAB — CBC
HCT: 40 % (ref 36.0–46.0)
Hemoglobin: 13.1 g/dL (ref 12.0–15.0)
MCH: 32.8 pg (ref 26.0–34.0)
MCHC: 32.8 g/dL (ref 30.0–36.0)
MCV: 100.3 fL — ABNORMAL HIGH (ref 78.0–100.0)
MPV: 11.4 fL (ref 8.6–12.4)
Platelets: 273 K/uL (ref 150–400)
RBC: 3.99 MIL/uL (ref 3.87–5.11)
RDW: 13.3 % (ref 11.5–15.5)
WBC: 5.6 10*3/uL (ref 4.0–10.5)

## 2014-10-21 LAB — LIPID PANEL
Cholesterol: 217 mg/dL — ABNORMAL HIGH (ref 0–200)
HDL: 83 mg/dL (ref >=46)
LDL Cholesterol: 95 mg/dL (ref 0–99)
Total CHOL/HDL Ratio: 2.6 Ratio
Triglycerides: 193 mg/dL — ABNORMAL HIGH (ref <150)
VLDL: 39 mg/dL (ref 0–40)

## 2014-10-21 LAB — COMPREHENSIVE METABOLIC PANEL
ALT: 38 U/L — ABNORMAL HIGH (ref 0–35)
AST: 38 U/L — ABNORMAL HIGH (ref 0–37)
Albumin: 4 g/dL (ref 3.5–5.2)
Alkaline Phosphatase: 51 U/L (ref 39–117)
BUN: 10 mg/dL (ref 6–23)
CO2: 27 mEq/L (ref 19–32)
Calcium: 9.9 mg/dL (ref 8.4–10.5)
Chloride: 106 mEq/L (ref 96–112)
Creat: 0.72 mg/dL (ref 0.50–1.10)
Glucose, Bld: 97 mg/dL (ref 70–99)
Potassium: 5 mEq/L (ref 3.5–5.3)

## 2014-10-21 LAB — COMPREHENSIVE METABOLIC PANEL WITH GFR
Sodium: 145 meq/L (ref 135–145)
Total Bilirubin: 0.4 mg/dL (ref 0.2–1.2)
Total Protein: 6.7 g/dL (ref 6.0–8.3)

## 2014-10-21 MED ORDER — NIACIN ER (ANTIHYPERLIPIDEMIC) 1000 MG PO TBCR: 1000.0000 mg | EXTENDED_RELEASE_TABLET | Freq: Every day | ORAL | Status: DC

## 2014-10-21 NOTE — Addendum Note (Signed)
Addended by: Monica BectonHEKKEKANDAM, Almalik Weissberg J on: 10/21/2014 01:13 PM   Modules accepted: Orders

## 2014-10-22 ENCOUNTER — Encounter: Admitting: Physical Therapy

## 2014-10-27 ENCOUNTER — Ambulatory Visit: Payer: Self-pay | Admitting: Sports Medicine

## 2014-10-28 ENCOUNTER — Other Ambulatory Visit: Payer: Self-pay

## 2014-10-28 ENCOUNTER — Emergency Department (INDEPENDENT_AMBULATORY_CARE_PROVIDER_SITE_OTHER)
Admission: EM | Admit: 2014-10-28 | Discharge: 2014-10-28 | Disposition: A | Discharge status: Home / Self Care | Source: Home / Self Care | Attending: Family Medicine | Admitting: Family Medicine

## 2014-10-28 ENCOUNTER — Encounter: Payer: Self-pay | Admitting: Emergency Medicine

## 2014-10-28 DIAGNOSIS — R1013 Epigastric pain: Secondary | ICD-10-CM

## 2014-10-28 DIAGNOSIS — R1084 Generalized abdominal pain: Secondary | ICD-10-CM

## 2014-10-28 MED ORDER — OMEPRAZOLE 20 MG PO CPDR: DELAYED_RELEASE_CAPSULE | ORAL | Status: DC

## 2014-10-28 NOTE — ED Notes (Signed)
Epigastric pain, bloating, tired, chills, headache, no appetite x 2 weeks, states husband had H. Pylori 3 months ago.

## 2014-10-28 NOTE — ED Provider Notes (Signed)
CSN: [id number]     Arrival date & time 10/28/14  1346 History   First MD Initiated Contact with Patient 10/28/14 1415     Chief Complaint  Patient presents with  . Abdominal Pain   (Consider location/radiation/quality/duration/timing/severity/associated sxs/prior Treatment) HPI Patient is a 59 year old female complaining of gradually worsening epigastric pain that is intermittent in nature, associated bloating fatigue, chills, decreased appetite, and headache.   Patient states pain is worse with eating certain foods especially carbonated beverages "heavy foods."  States she waits too long to eat she becomes nauseous and pain worsens however if she eats too much pain also worsens. Pain does radiate to the back on occasion. Pain is moderate in severity described as a gnawing sensation. Pain is worse in the left upper quadrant and epigastrium. Denies nausea vomiting diarrhea. Patient does state she used to drink [employer] daily however has had to cut back due to pain. Patient thinks headaches are due to caffeine withdrawal. Pt also reports drinking alcohol more recently and more frequently over the last few months due to stress in her family. Denies hx of pancreatitis.  States her husband did test positive for H. pylori 3 months ago. Patient is concerned she also has H. pylori. Patient is not on any antacid medication besides over-the-counter Tums. The Tums did not provide any relief. Patient did have routine CBC and CMP on June 28 which was normal. Denies urinary or vaginal symptoms.  Past Medical History  Diagnosis Date  . Hyperlipemia   . Osteoporosis   . Degenerative disc disease    Past Surgical History  Procedure Laterality Date  . Tubal ligation    . Cystocele repair     Family History  Problem Relation Age of Onset  . Cancer Mother     breast  . Cancer Father     prostate   History  Substance Use Topics  . Smoking status: Current Every Day Smoker -- 0.50 packs/day for 40 years     Types: Cigarettes  . Smokeless tobacco: Never Used  . Alcohol Use: Yes   OB History    No data available     Review of Systems  Constitutional: Positive for chills, appetite change and fatigue. Negative for fever, diaphoresis and unexpected weight change.  HENT: Negative for congestion, sore throat, trouble swallowing and voice change.   Respiratory: Negative for cough and shortness of breath.   Cardiovascular: Negative for chest pain, palpitations and leg swelling.  Gastrointestinal: Positive for abdominal pain ( epigastric and LUQ). Negative for nausea, vomiting, diarrhea and constipation.  Genitourinary: Negative for dysuria, frequency, hematuria, flank pain, vaginal bleeding, vaginal discharge and pelvic pain.  Musculoskeletal: Positive for back pain. Negative for myalgias.  Neurological: Positive for headaches. Negative for dizziness, syncope, weakness and light-headedness.  All other systems reviewed and are negative.   Allergies  Naproxen and Tramadol  Home Medications   Prior to Admission medications   Medication Sig Start Date End Date Taking? Authorizing Provider  acetaminophen (TYLENOL) 650 MG CR tablet Take 1 tablet (650 mg total) by mouth every 8 (eight) hours as needed for pain. 09/15/14   Monica Bectonhomas J Thekkekandam, MD  Calcium Carbonate-Vitamin D 600-400 MG-UNIT per tablet Take 1 tablet by mouth 2 (two) times daily. 08/18/14   Monica Bectonhomas J Thekkekandam, MD  denosumab (PROLIA) 60 MG/ML SOLN injection Inject 60 mg into the skin every 6 (six) months. Administer in upper arm, thigh, or abdomen 08/18/14   Monica Bectonhomas J Thekkekandam, MD  Docusate Calcium (  STOOL SOFTENER PO) Take by mouth.    Historical Provider, MD  halobetasol (ULTRAVATE) 0.05 % cream Apply topically 2 (two) times daily. 09/11/13   Lajean Manes, MD  meloxicam (MOBIC) 15 MG tablet One tab PO qAM with breakfast for 2 weeks, then daily prn pain. 09/15/14   Monica Becton, MD  niacin (NIASPAN) 1000 MG CR tablet Take 1  tablet (1,000 mg total) by mouth at bedtime. 10/21/14   Monica Becton, MD  omeprazole (PRILOSEC) 20 MG capsule Take 2 tabs by mouth daily for 10 days, then 1 tab daily for 10 days 10/28/14   Junius Finner, PA-C  simvastatin (ZOCOR) 40 MG tablet Take 1 tablet (40 mg total) by mouth every evening. 09/15/14   Monica Becton, MD   BP 124/85 mmHg  Pulse 90  Temp(Src) 98.4 F (36.9 C) (Oral)  Ht  (1.6 m)  Wt 142 lb (64.411 kg)  BMI 25.16 kg/m2  SpO2 98% Physical Exam  Constitutional: She appears well-developed and well-nourished. No distress.  HENT:  Head: Normocephalic and atraumatic.  Eyes: Conjunctivae are normal. No scleral icterus.  Neck: Normal range of motion.  Cardiovascular: Normal rate, regular rhythm and normal heart sounds.   Pulmonary/Chest: Effort normal and breath sounds normal. No respiratory distress. She has no wheezes. She has no rales. She exhibits no tenderness.  Abdominal: Soft. Bowel sounds are normal. She exhibits no distension and no mass. There is tenderness. There is no rebound and no guarding.  Obese, soft abdomen. Tenderness in epigastrium and LUQ w/o rebound or guarding.  No masses palpated. No CVAT.  Musculoskeletal: Normal range of motion.  Neurological: She is alert.  Skin: Skin is warm and dry. She is not diaphoretic.  Nursing note and vitals reviewed.   ED Course  Procedures (including critical care time) Labs Review Labs Reviewed  LIPASE    Imaging Review No results found.   MDM   1. Abdominal pain, epigastric     Pt is a 59yo female presenting to UC with c/o gradually worsening, intermittent epigastric and LUQ pain.  Pain worse with certain meals.  Hx and exam c/w gastritis. Exam not concerning for surgical abdomen, including but not limited to cholecystitis, appendicitis, mesenteric ischemia or SBO.  Medical records reviewed, including normal CBC and CMP performed on 10/20/14.  Will order lipase as pt does report increased  alcohol intake.    Pt unable to f/u with her PCP for another 1-2 weeks. Pt concerned for H. Pylori and insists on having a test today.  Blood test originally ordered, informed by RN that test was recently discontinued. Will get breath test.  Will f/u with pt on results and discuss further treatment. Pt was prescribed omeprazole and provided home resources for foods to eat and foods to avoid for GERD. Encouraged to continue cutting back on sodas as well as alcohol.  Advised to schedule a f/u appointment with her PCP in 1-2 weeks for recheck of symptoms. Return precautions provided. Pt verbalized understanding and agreement with tx plan.     Junius Finner, PA-C 10/28/14 1533

## 2014-10-29 LAB — LIPASE: Lipase: 12 U/L (ref 0–75)

## 2014-10-29 LAB — H. PYLORI BREATH TEST: H. pylori Breath Test: NOT DETECTED

## 2014-10-30 ENCOUNTER — Telehealth: Payer: Self-pay | Admitting: *Deleted

## 2014-11-10 ENCOUNTER — Ambulatory Visit: Payer: Self-pay | Admitting: Sports Medicine

## 2014-11-29 ENCOUNTER — Encounter: Payer: Self-pay | Admitting: Sports Medicine

## 2014-11-30 MED ORDER — OMEPRAZOLE 20 MG PO CPDR: 20.0000 mg | DELAYED_RELEASE_CAPSULE | Freq: Every day | ORAL | Status: DC

## 2014-12-11 ENCOUNTER — Encounter (HOSPITAL_COMMUNITY): Payer: Self-pay | Admitting: *Deleted

## 2014-12-11 ENCOUNTER — Inpatient Hospital Stay (HOSPITAL_COMMUNITY)
Admission: AD | Admit: 2014-12-11 | Discharge: 2014-12-14 | DRG: 897 | Disposition: A | Discharge status: Home / Self Care | Attending: Psychiatry | Admitting: Psychiatry

## 2014-12-11 ENCOUNTER — Emergency Department (HOSPITAL_COMMUNITY)
Admission: EM | Admit: 2014-12-11 | Discharge: 2014-12-11 | Disposition: A | Discharge status: Home / Self Care | Attending: Emergency Medicine | Admitting: Emergency Medicine

## 2014-12-11 DIAGNOSIS — F1010 Alcohol abuse, uncomplicated: Secondary | ICD-10-CM | POA: Insufficient documentation

## 2014-12-11 DIAGNOSIS — F10129 Alcohol abuse with intoxication, unspecified: Secondary | ICD-10-CM | POA: Diagnosis present

## 2014-12-11 DIAGNOSIS — F17210 Nicotine dependence, cigarettes, uncomplicated: Secondary | ICD-10-CM | POA: Diagnosis present

## 2014-12-11 DIAGNOSIS — E785 Hyperlipidemia, unspecified: Secondary | ICD-10-CM | POA: Insufficient documentation

## 2014-12-11 DIAGNOSIS — Z79899 Other long term (current) drug therapy: Secondary | ICD-10-CM | POA: Diagnosis not present

## 2014-12-11 DIAGNOSIS — F329 Major depressive disorder, single episode, unspecified: Secondary | ICD-10-CM | POA: Diagnosis present

## 2014-12-11 DIAGNOSIS — F419 Anxiety disorder, unspecified: Secondary | ICD-10-CM | POA: Diagnosis present

## 2014-12-11 DIAGNOSIS — M810 Age-related osteoporosis without current pathological fracture: Secondary | ICD-10-CM | POA: Insufficient documentation

## 2014-12-11 DIAGNOSIS — Z720 Tobacco use: Secondary | ICD-10-CM | POA: Diagnosis not present

## 2014-12-11 DIAGNOSIS — F32A Depression, unspecified: Secondary | ICD-10-CM

## 2014-12-11 DIAGNOSIS — Y903 Blood alcohol level of 60-79 mg/100 ml: Secondary | ICD-10-CM | POA: Diagnosis present

## 2014-12-11 DIAGNOSIS — Z008 Encounter for other general examination: Secondary | ICD-10-CM | POA: Diagnosis present

## 2014-12-11 DIAGNOSIS — F10280 Alcohol dependence with alcohol-induced anxiety disorder: Secondary | ICD-10-CM | POA: Diagnosis present

## 2014-12-11 DIAGNOSIS — Z803 Family history of malignant neoplasm of breast: Secondary | ICD-10-CM | POA: Diagnosis not present

## 2014-12-11 DIAGNOSIS — F1020 Alcohol dependence, uncomplicated: Secondary | ICD-10-CM | POA: Diagnosis present

## 2014-12-11 DIAGNOSIS — F10180 Alcohol abuse with alcohol-induced anxiety disorder: Secondary | ICD-10-CM

## 2014-12-11 DIAGNOSIS — F10980 Alcohol use, unspecified with alcohol-induced anxiety disorder: Secondary | ICD-10-CM | POA: Diagnosis not present

## 2014-12-11 DIAGNOSIS — F1994 Other psychoactive substance use, unspecified with psychoactive substance-induced mood disorder: Secondary | ICD-10-CM | POA: Diagnosis present

## 2014-12-11 HISTORY — DX: Alcohol dependence, uncomplicated: F10.20

## 2014-12-11 LAB — COMPREHENSIVE METABOLIC PANEL
ALT: 48 U/L (ref 14–54)
AST: 58 U/L — ABNORMAL HIGH (ref 15–41)
Albumin: 4.7 g/dL (ref 3.5–5.0)
Alkaline Phosphatase: 72 U/L (ref 38–126)
Anion gap: 11 (ref 5–15)
BUN: 11 mg/dL (ref 6–20)
CO2: 23 mmol/L (ref 22–32)
Calcium: 9.4 mg/dL (ref 8.9–10.3)
Chloride: 105 mmol/L (ref 101–111)
Creatinine, Ser: 0.67 mg/dL (ref 0.44–1.00)
GFR calc Af Amer: >60 mL/min (ref >60)
Glucose, Bld: 94 mg/dL (ref 65–99)
Potassium: 3.6 mmol/L (ref 3.5–5.1)
Sodium: 139 mmol/L (ref 135–145)
Total Bilirubin: 0.3 mg/dL (ref 0.3–1.2)
Total Protein: 8.3 g/dL — ABNORMAL HIGH (ref 6.5–8.1)

## 2014-12-11 LAB — RAPID URINE DRUG SCREEN, HOSP PERFORMED
Amphetamines: NOT DETECTED
BARBITURATES: NOT DETECTED
BENZODIAZEPINES: NOT DETECTED
Cocaine: NOT DETECTED
Opiates: NOT DETECTED
TETRAHYDROCANNABINOL: NOT DETECTED

## 2014-12-11 LAB — CBC
HCT: 41.9 % (ref 36.0–46.0)
HEMOGLOBIN: 14.3 g/dL (ref 12.0–15.0)
MCH: 33.6 pg (ref 26.0–34.0)
MCHC: 34.1 g/dL (ref 30.0–36.0)
MCV: 98.6 fL (ref 78.0–100.0)
Platelets: 223 10*3/uL (ref 150–400)
RBC: 4.25 MIL/uL (ref 3.87–5.11)
RDW: 12.7 % (ref 11.5–15.5)
WBC: 8 10*3/uL (ref 4.0–10.5)

## 2014-12-11 LAB — ETHANOL: ALCOHOL ETHYL (B): 72 mg/dL — AB (ref <5)

## 2014-12-11 MED ORDER — NICOTINE 21 MG/24HR TD PT24
21.0000 mg | MEDICATED_PATCH | Freq: Every day | TRANSDERMAL | Status: DC
  Administered 2014-12-11 – 2014-12-14 (×4): 21 mg via TRANSDERMAL
  Filled 2014-12-11 (×7): qty 1

## 2014-12-11 MED ORDER — MAGNESIUM HYDROXIDE 400 MG/5ML PO SUSP: 30.0000 mL | Freq: Every day | ORAL | Status: DC | PRN

## 2014-12-11 MED ORDER — CHLORDIAZEPOXIDE HCL 25 MG PO CAPS
25.0000 mg | ORAL_CAPSULE | Freq: Four times a day (QID) | ORAL | Status: AC
  Administered 2014-12-11 – 2014-12-13 (×6): 25 mg via ORAL
  Filled 2014-12-11 (×6): qty 1

## 2014-12-11 MED ORDER — LOPERAMIDE HCL 2 MG PO CAPS: 2.0000 mg | ORAL_CAPSULE | ORAL | Status: DC | PRN

## 2014-12-11 MED ORDER — LORAZEPAM 1 MG PO TABS: 0.0000 mg | ORAL_TABLET | Freq: Two times a day (BID) | ORAL | Status: DC

## 2014-12-11 MED ORDER — CHLORDIAZEPOXIDE HCL 25 MG PO CAPS
25.0000 mg | ORAL_CAPSULE | Freq: Three times a day (TID) | ORAL | Status: AC
  Administered 2014-12-13 – 2014-12-14 (×3): 25 mg via ORAL
  Filled 2014-12-11 (×3): qty 1

## 2014-12-11 MED ORDER — ALUM & MAG HYDROXIDE-SIMETH 200-200-20 MG/5ML PO SUSP: 30.0000 mL | ORAL | Status: DC | PRN

## 2014-12-11 MED ORDER — CHLORDIAZEPOXIDE HCL 25 MG PO CAPS: 25.0000 mg | ORAL_CAPSULE | Freq: Every day | ORAL | Status: DC

## 2014-12-11 MED ORDER — THIAMINE HCL 100 MG/ML IJ SOLN: 100.0000 mg | Freq: Once | INTRAMUSCULAR | Status: DC

## 2014-12-11 MED ORDER — CHLORDIAZEPOXIDE HCL 25 MG PO CAPS: 25.0000 mg | ORAL_CAPSULE | ORAL | Status: DC

## 2014-12-11 MED ORDER — THIAMINE HCL 100 MG/ML IJ SOLN: 100.0000 mg | Freq: Every day | INTRAMUSCULAR | Status: DC

## 2014-12-11 MED ORDER — ACETAMINOPHEN 325 MG PO TABS
650.0000 mg | ORAL_TABLET | Freq: Four times a day (QID) | ORAL | Status: DC | PRN
  Administered 2014-12-11 – 2014-12-12 (×2): 650 mg via ORAL
  Filled 2014-12-11 (×3): qty 2

## 2014-12-11 MED ORDER — PNEUMOCOCCAL VAC POLYVALENT 25 MCG/0.5ML IJ INJ: 0.5000 mL | INJECTION | INTRAMUSCULAR | Status: DC

## 2014-12-11 MED ORDER — CHLORDIAZEPOXIDE HCL 25 MG PO CAPS
25.0000 mg | ORAL_CAPSULE | Freq: Four times a day (QID) | ORAL | Status: DC | PRN
  Administered 2014-12-11 – 2014-12-13 (×2): 25 mg via ORAL
  Filled 2014-12-11 (×2): qty 1

## 2014-12-11 MED ORDER — VITAMIN B-1 100 MG PO TABS
100.0000 mg | ORAL_TABLET | Freq: Every day | ORAL | Status: DC
  Administered 2014-12-11: 100 mg via ORAL
  Filled 2014-12-11: qty 1

## 2014-12-11 MED ORDER — LORAZEPAM 1 MG PO TABS
0.0000 mg | ORAL_TABLET | Freq: Four times a day (QID) | ORAL | Status: DC
  Administered 2014-12-11: 1 mg via ORAL
  Filled 2014-12-11: qty 1

## 2014-12-11 MED ORDER — ONDANSETRON 4 MG PO TBDP: 4.0000 mg | ORAL_TABLET | Freq: Four times a day (QID) | ORAL | Status: DC | PRN

## 2014-12-11 MED ORDER — VITAMIN B-1 100 MG PO TABS
100.0000 mg | ORAL_TABLET | Freq: Every day | ORAL | Status: DC
  Administered 2014-12-12 – 2014-12-14 (×3): 100 mg via ORAL
  Filled 2014-12-11 (×5): qty 1

## 2014-12-11 MED ORDER — HYDROXYZINE HCL 25 MG PO TABS
25.0000 mg | ORAL_TABLET | Freq: Four times a day (QID) | ORAL | Status: DC | PRN
  Administered 2014-12-11: 25 mg via ORAL
  Filled 2014-12-11: qty 1

## 2014-12-11 MED ORDER — ADULT MULTIVITAMIN W/MINERALS CH
1.0000 | ORAL_TABLET | Freq: Every day | ORAL | Status: DC
  Administered 2014-12-11 – 2014-12-14 (×4): 1 via ORAL
  Filled 2014-12-11 (×7): qty 1
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
## 2014-12-11 NOTE — ED Notes (Addendum)
Pt requesting ETOH detox. Drinks 8-9 drinks/daily x1 year. On and off drinking x19 years. Went to rehab 11 years ago. Pt drinks all day until she goes to bed. Sleeps from 2am-10am. Last ETOH today 8/18 at 0900  Denies SI/HI, AH/VH

## 2014-12-11 NOTE — Tx Team (Signed)
Initial Interdisciplinary Treatment Plan   PATIENT STRESSORS: Health problems Substance abuse   PATIENT STRENGTHS: Ability for insight Active sense of humor General fund of knowledge Motivation for treatment/growth   PROBLEM LIST: Problem List/Patient Goals Date to be addressed Date deferred Reason deferred Estimated date of resolution  " I've been drinking to much" 12/11/2014   12/17/2014  " I don't want to die" 12/11/2014   12/17/2014                                             DISCHARGE CRITERIA:  Ability to meet basic life and health needs Improved stabilization in mood, thinking, and/or behavior  PRELIMINARY DISCHARGE PLAN: Attend 12-step recovery group Return to previous living arrangement  PATIENT/FAMIILY INVOLVEMENT: This treatment plan has been presented to and reviewed with the patient, Ann [name], and/or family member, Husband.  The patient and family have been given the opportunity to ask questions and make suggestions.  Jimmey Ralph 12/11/2014, 7:42 PM

## 2014-12-11 NOTE — Progress Notes (Signed)
Admit Note : 59y/o female admitted from [facility] Pt reports she has been drinking for the past 11 years and this past year it has become increasingly worst.Reports being more depressed and anxious lately since, sister in law lived with them and became psychotic . " We really wanted to help her but she tried to kill her self and now she's in a nursing home." Pt states, she finally told her husband that she was drinking from morning on, everyday. Pt said  he had no idea the extent of her drinking 8 large beers daily..Pt is tearful, has hx of osteoarthritis with chronic left hip pain. Pt has contracted for safety. Oriented to the unit, Education provided about safety on the unit, including fall prevention. Nutrition offered, safety checks initiated every 15 minutes. Search completed.  Marland Kitchen

## 2014-12-11 NOTE — ED Notes (Signed)
Awaiting transport to [facility]

## 2014-12-11 NOTE — ED Notes (Signed)
Pt has bed at [facility] 304-1, can be transferred once pt is medically cleared.

## 2014-12-11 NOTE — Progress Notes (Signed)
Tonight in wrap up group Ann [name] stated that her day was a 5 she said it was better than previous days but a long day with a load of reading. A positive part of her day is she didn't drink today.

## 2014-12-11 NOTE — Discharge Instructions (Signed)
°Emergency Department Resource Guide °1) Find a Doctor and Pay Out of Pocket °Although you won't have to find out who is covered by your insurance plan, it is a good idea to ask around and get recommendations. You will then need to call the office and see if the doctor you have chosen will accept you as a new patient and what types of options they offer for patients who are self-pay. Some doctors offer discounts or will set up payment plans for their patients who do not have insurance, but you will need to ask so you aren't surprised when you get to your appointment. ° °2) Contact Your Local Health Department °Not all health departments have doctors that can see patients for sick visits, but many do, so it is worth a call to see if yours does. If you don't know where your local health department is, you can check in your phone book. The CDC also has a tool to help you locate your state's health department, and many state websites also have listings of all of their local health departments. ° °3) Find a Walk-in Clinic °If your illness is not likely to be very severe or complicated, you may want to try a walk in clinic. These are popping up all over the country in pharmacies, drugstores, and shopping centers. They're usually staffed by nurse practitioners or physician assistants that have been trained to treat common illnesses and complaints. They're usually fairly quick and inexpensive. However, if you have serious medical issues or chronic medical problems, these are probably not your best option. ° °No Primary Care Doctor: °- Call [facility] at  832-8000 - they can help you locate a primary care doctor that  accepts your insurance, provides certain services, etc. °- Physician Referral Service- 1-800-533-3463 ° °Chronic Pain Problems: °Organization         Address  Phone   Notes  °[facility]  (336) 297-2271 Patients need to be referred by their primary care doctor.  ° °Medication  Assistance: °Organization         Address  Phone   Notes  °[place] [facility] [address] °[place], [place] [address] (336) 641-8030 --Must be a resident of [place] °-- Must have NO insurance coverage whatsoever (no Medicaid/ Medicare, etc.) °-- The pt. MUST have a primary care doctor that directs their care regularly and follows them in the community °  °[facility]  (866) 331-1348   °[employer]  (888) 892-1162   ° °Agencies that provide inexpensive medical care: °Organization         Address  Phone   Notes  °[facility]  (336) 832-8035   °[facility]    (336) 832-7272   °[facility] [address] °[place], [place] [address] (336) 832-4777   °[facility] [address], °[place] (336) 271-4999   °[facility]    (336) 373-0678   °[facility]    (336) 272-1050   °[facility] ° [address], [place] Phone:  (336) 832-4444, Fax:  (336) 832-4440 Hours of Operation:  9 am - 6 pm, M-F.  Also accepts Medicaid/Medicare and self-pay.  °[facility] ° [address], [place] Phone: (336) 832-3150, Fax: (336) 832-3151. Hours of Operation:  8:30 am - 5:30 pm, M-F.  Also accepts Medicaid and self-pay.  °[facility] [place] [address], [place] Phone: (336) 878-6027   °[facility] [address], [place], [place] (336)723-1848, Ext. 123 Mondays & Thursdays: 7-9 AM.  First 15 patients are seen on a first come, first serve basis. °  ° °Medicaid-accepting [place] Providers: ° °Organization         Address  Phone   Notes  °[facility] [address], [place] (336) 641-2100 Also accepts self-pay patients.  °[facility] [address], [place] ° (336) 856-9996   °[facility] [address], [place]  (336) 288-8857   °[facility] [address] [place] [address], [place] (336) 299-7000   °Veita Bland [address], [place]  ° (336) 373-1557 Only accepts [place] Access Medicaid patients after they have their name applied to their card.  ° °Self-Pay (no insurance) in [place]: ° °Organization         Address  Phone   Notes  °Sickle Cell Patients, [facility] [address], [place] (336) 832-1970   °[facility] [address], [place] (336) 832-4400   °[facility] ° [address], [place] (336) 992-4800   °[facility]/Dr. Osei-Bonsu ° [address] [place] [address], [place] or [address], [place] (336) 841-8500 Phone number for both [place] and [place] locations is the same.  °[facility] [address], [place] (336) 299-0000   °[facility] [address] [place] [address], [place] or [address] (336) 852-7530 °(336) 878-2260   °[facility] [address], [place] (336) 350-1642, phone; (336) 294-5005, fax Sees patients 1st and 3rd Saturday of every month.  Must not qualify for public or private insurance (i.e. Medicaid, Medicare, [place] Health Choice, Veterans' Benefits) • Household income should be no more than 200% of the poverty level •The clinic cannot treat you if you are pregnant or think you are pregnant • Sexually transmitted diseases are not treated at the clinic.  ° ° °Dental Care: °Organization         Address  Phone  Notes  °[place] [facility] [address], [place] (336) 641-6152 Accepts children up to age 21 who are enrolled in Medicaid or [place] Health Choice; pregnant women with a Medicaid card; and children who have applied for Medicaid or [place] Health Choice, but were declined, whose parents can pay a reduced fee at time of service.  °[place]  [facility] [place]  [address], [place] (336) 641-7733 Accepts children up to age 21 who are enrolled in Medicaid or [place] Health Choice; pregnant women with a Medicaid card; and children who have applied for Medicaid or [place] Health Choice, but were declined, whose parents can pay a reduced fee at time of service.  °[facility] ° [address], [place] (336) 641-4533 Patients are seen by appointment only. Walk-ins are not accepted. [facility] will see patients 18 years of age and older. °Monday - Tuesday (8am-5pm) °Most Wednesdays (8:30-5pm) °$30 per visit, cash only  °[facility] ° [address], [place] (336) 641-4533 Patients are seen by appointment only. Walk-ins are not accepted. [facility] will see patients 18 years of age and older. °One   Wednesday Evening (Monthly: Volunteer Based).  $30 per visit, cash only  °[facility]  (919) 537-3737 for adults; Children under age 4, call Graduate Pediatric Dentistry at (919) [phone]. Children aged 4-14, please call (919) [phone] to request a pediatric application. ° Dental services are provided in all areas of dental care including fillings, crowns and bridges, complete and partial dentures, implants, gum treatment, root canals, and extractions. Preventive care is also provided. Treatment is provided to both adults and children. °Patients are selected via a lottery and there is often a waiting list. °  °[facility] [address], °[place] ° (336) 763-8833 [email or web address].[email or web address] °  °[facility] [address], [place], [place] (336)723-1848, Ext. 123 Second and Fourth Thursday of each month, opens at 6:30 AM; Clinic ends at 9 AM.  Patients are seen on a first-come first-served basis, and a limited number are seen during each clinic.  ° °[facility] ° [address], [place], [place] (336) 723-7904    Eligibility Requirements °You must have lived in [place], [place], or [place] counties for at least the last three months. °  You cannot be eligible for state or federal sponsored healthcare insurance, including Veterans Administration, Medicaid, or Medicare. °  You generally cannot be eligible for healthcare insurance through your employer.  °  How to apply: °Eligibility screenings are held every Tuesday and Wednesday afternoon from 1:00 pm until 4:00 pm. You do not need an appointment for the interview!  °[facility] [address], [place], [place] 336-631-2330   °[facility]  336-342-8273   °[facility]  336-703-3100   °[facility]  336-570-6415   ° °Behavioral Health Resources in the Community: °Intensive Outpatient Programs °Organization         Address  Phone  Notes  °[facility] [address], [place], [place] 336-878-6098   °[facility] [address], [place], [place] 336-832-9800   °[facility] [address], [place], [place] ° 336-882-2125   °[facility] [address],  °[place], [place] 1-800-853-5163 or 336-641-4981   °Substance Abuse Resources °Organization         Address  Phone  Notes  °[facility]  336-882-2125   °[facility]  336-784-9470   °[facility]  336-285-9073   °[facility]  336-845-3988   °Residential & Outpatient Substance Abuse Program  1-800-659-3381   °Psychological Services °Organization         Address  Phone  Notes  °[facility]  336- 832-9600   °[facility]  336- 378-7881   °[facility] [address], [place] 1-800-853-5163 or 336-641-4981   ° °Mobile Crisis Teams °Organization         Address  Phone  Notes  °[facility], Mobile Crisis Care Unit  1-877-626-1772   °Assertive °[facility] ° [address]  [place], [place] 336-834-9664   °Sharon DeEsch [address] °[place] 336-554-5454   ° °Self-Help/Support Groups °Organization         Address  Phone             Notes  °[facility]  - variety of support groups  336- 373-1402 Call for more information  °Narcotics Anonymous (NA), [facility] [address], °[place]  2 meetings at this location  ° °  Residential Treatment Programs Organization         Address  Phone  Notes  [facility] [address],    [place]  1-610-960-4540   [facility]  [address], [place] [address], [place], [place] [phone]   [facility] [address] [place], [place] 086-578-4696 Admissions: 8am-3pm M-F  [facility] [address],    [place], [place] 295-284-1324   [facility] [address] [place], [place], [place] 401-027-2536   [facility] [address],  [place], [place] 644-034-7425   [facility] Intensive Outpatient [address], [place], [place] 956-387-5643   [facility] ([facility] [address] [place]  [place], [place] 3-295-188-4166 or 805-304-0996   [facility] [address] [place], [place] 323-557-3220 Accepts Medicaid  [facility] [address]  [place] 2-542-706-2376 Substance Abuse/Addiction Treatment   [facility]         Address  Phone  Notes  [facility]  (646) 697-5012   Angie Fava, PhD [address] [place], [place]   815-428-5212 or (806)835-7642   [facility]   [address] [place], [place] 859-750-4236   [facility] [address] [place], [place] 503-592-4321 Insurance/Medicaid/sponsorship through [facility] [address]                                    [place], [place] [phone] Therapy/tele-psych/case    [facility] [address], [place] (951) 868-3784    Dr. Lolly Mustache  239-634-5576   [facility]  [employer] [facility] Dept. 1) [address] [place] 2) [address], [place] 3)  [address], [place] (616)[phone]  (442)[phone]   [facility] Child Abuse Hotline (828) 695-6691 or 587-478-7670 (After Hours)      Go to [facility] after you are discharged from the Emergency Department. They are expecting you.

## 2014-12-11 NOTE — ED Notes (Signed)
Report given to RN at [facility] 

## 2014-12-11 NOTE — ED Notes (Signed)
Bed: [facility]PT4 Expected date:  Expected time:  Means of arrival:  Comments: Pt still in room  

## 2014-12-11 NOTE — ED Notes (Signed)
md at bedside  Pt alert and oriented x4. Respirations even and unlabored, bilateral symmetrical rise and fall of chest. Skin warm and dry. In no acute distress. Denies needs.   

## 2014-12-11 NOTE — Progress Notes (Signed)
Patient ID: Ann [name], female   DOB: [birth year], 59 y.o.   MRN: [id number]  D: Patient pleasant on approach tonight. Reports having some shakes and a headache tonight. Interacting well with others. Contracts for safety on the unit tonight. A: Staff will monitor on q 15 minute checks, follow treatment plan, and give medications as ordered. R: Cooperative on the unit.

## 2014-12-11 NOTE — ED Provider Notes (Signed)
CSN: [id number]     Arrival date & time 12/11/14  1241 History   First MD Initiated Contact with Patient 12/11/14 1250     Chief Complaint  Patient presents with  . Medical Clearance for ETOH       HPI Pt was seen at 1300.  Per pt, c/o gradual onset and persistence of constant etoh abuse for the past 19 years, worse over the past 1 year. LD etoh 0900 today. Pt states she [facility], then was sent to the ED for medical clearance before admission there.  Denies SI, no HI, no hallucinations.    Past Medical History  Diagnosis Date  . Hyperlipemia   . Osteoporosis   . Degenerative disc disease   . Alcoholism    Past Surgical History  Procedure Laterality Date  . Tubal ligation    . Cystocele repair     Family History  Problem Relation Age of Onset  . Cancer Mother     breast  . Cancer Father     prostate   Social History  Substance Use Topics  . Smoking status: Current Every Day Smoker -- 0.50 packs/day for 40 years    Types: Cigarettes  . Smokeless tobacco: Never Used  . Alcohol Use: 4.8 oz/week    8 Cans of beer per week     Comment: 8-9 drinks daily    Review of Systems ROS: Statement: All systems negative except as marked or noted in the HPI; Constitutional: Negative for fever and chills. ; ; Eyes: Negative for eye pain, redness and discharge. ; ; ENMT: Negative for ear pain, hoarseness, nasal congestion, sinus pressure and sore throat. ; ; Cardiovascular: Negative for chest pain, palpitations, diaphoresis, dyspnea and peripheral edema. ; ; Respiratory: Negative for cough, wheezing and stridor. ; ; Gastrointestinal: Negative for nausea, vomiting, diarrhea, abdominal pain, blood in stool, hematemesis, jaundice and rectal bleeding. . ; ; Genitourinary: Negative for dysuria, flank pain and hematuria. ; ; Musculoskeletal: Negative for back pain and neck pain. Negative for swelling and trauma.; ; Skin: Negative for pruritus, rash, abrasions, blisters, bruising and  skin lesion.; ; Neuro: Negative for headache, lightheadedness and neck stiffness. Negative for weakness, altered level of consciousness , altered mental status, extremity weakness, paresthesias, involuntary movement, seizure and syncope.; Psych:  No SI, no SA, no HI, no hallucinations.      Allergies  Naproxen and Tramadol  Home Medications   Prior to Admission medications   Medication Sig Start Date End Date Taking? Authorizing Provider  acetaminophen (TYLENOL) 650 MG CR tablet Take 1 tablet (650 mg total) by mouth every 8 (eight) hours as needed for pain. 09/15/14   Monica Becton, MD  Calcium Carbonate-Vitamin D 600-400 MG-UNIT per tablet Take 1 tablet by mouth 2 (two) times daily. 08/18/14   Monica Becton, MD  denosumab (PROLIA) 60 MG/ML SOLN injection Inject 60 mg into the skin every 6 (six) months. Administer in upper arm, thigh, or abdomen 08/18/14   Monica Becton, MD  Docusate Calcium (STOOL SOFTENER PO) Take by mouth.    Historical Provider, MD  halobetasol (ULTRAVATE) 0.05 % cream Apply topically 2 (two) times daily. 09/11/13   Lajean Manes, MD  meloxicam (MOBIC) 15 MG tablet One tab PO qAM with breakfast for 2 weeks, then daily prn pain. 09/15/14   Monica Becton, MD  niacin (NIASPAN) 1000 MG CR tablet Take 1 tablet (1,000 mg total) by mouth at bedtime. 10/21/14   Maisie Fus  Windell Moment, MD  omeprazole (PRILOSEC) 20 MG capsule Take 1 capsule (20 mg total) by mouth daily. 11/30/14   Monica Becton, MD  simvastatin (ZOCOR) 40 MG tablet Take 1 tablet (40 mg total) by mouth every evening. 09/15/14   Monica Becton, MD   BP 132/81 mmHg  Pulse 104  Temp(Src) 98.2 F (36.8 C) (Oral)  Resp 17  SpO2 96% Physical Exam 1305: Physical examination:  Nursing notes reviewed; Vital signs and O2 SAT reviewed;  Constitutional: Well developed, Well nourished, Well hydrated, In no acute distress; Head:  Normocephalic, atraumatic; Eyes: EOMI, PERRL, No scleral  icterus; ENMT: Mouth and pharynx normal, Mucous membranes moist; Neck: Supple, Full range of motion, No lymphadenopathy; Cardiovascular: Regular rate and rhythm, No murmur, rub, or gallop; Respiratory: Breath sounds clear & equal bilaterally, No rales, rhonchi, wheezes.  Speaking full sentences with ease, Normal respiratory effort/excursion; Chest: Nontender, Movement normal; Abdomen: Soft, Nontender, Nondistended, Normal bowel sounds;; Extremities: Pulses normal, No tenderness, No edema, No calf edema or asymmetry.; Neuro: AA&Ox3, Major CN grossly intact.  Speech clear. No gross focal motor or sensory deficits in extremities.; Skin: Color normal, Warm, Dry.; Psych:  Tearful at times. Denies SI.     ED Course  Procedures    Labs Review I have personally reviewed and evaluated these images and lab results as part of my medical decision-making.   EKG Interpretation None      MDM  MDM Reviewed: previous chart, nursing note and vitals Reviewed previous: labs Interpretation: labs     Results for orders placed or performed during the hospital encounter of 12/11/14  Comprehensive metabolic panel  Result Value Ref Range   Sodium 139 135 - 145 mmol/L   Potassium 3.6 3.5 - 5.1 mmol/L   Chloride 105 101 - 111 mmol/L   CO2 23 22 - 32 mmol/L   Glucose, Bld 94 65 - 99 mg/dL   BUN 11 6 - 20 mg/dL   Creatinine, Ser 1.61 0.44 - 1.00 mg/dL   Calcium 9.4 8.9 - 09.6 mg/dL   Total Protein 8.3 (H) 6.5 - 8.1 g/dL   Albumin 4.7 3.5 - 5.0 g/dL   AST 58 (H) 15 - 41 U/L   ALT 48 14 - 54 U/L   Alkaline Phosphatase 72 38 - 126 U/L   Total Bilirubin 0.3 0.3 - 1.2 mg/dL   GFR calc non Af Amer >60 >60 mL/min   GFR calc Af Amer >60 >60 mL/min   Anion gap 11 5 - 15  Ethanol (ETOH)  Result Value Ref Range   Alcohol, Ethyl (B) 72 (H) <5 mg/dL  CBC  Result Value Ref Range   WBC 8.0 4.0 - 10.5 K/uL   RBC 4.25 3.87 - 5.11 MIL/uL   Hemoglobin 14.3 12.0 - 15.0 g/dL   HCT 04.5 40.9 - 81.1 %   MCV 98.6  78.0 - 100.0 fL   MCH 33.6 26.0 - 34.0 pg   MCHC 34.1 30.0 - 36.0 g/dL   RDW 91.4 78.2 - 95.6 %   Platelets 223 150 - 400 K/uL  Urine rapid drug screen (hosp performed) (Not at [facility])  Result Value Ref Range   Opiates NONE DETECTED NONE DETECTED   Cocaine NONE DETECTED NONE DETECTED   Benzodiazepines NONE DETECTED NONE DETECTED   Amphetamines NONE DETECTED NONE DETECTED   Tetrahydrocannabinol NONE DETECTED NONE DETECTED   Barbiturates NONE DETECTED NONE DETECTED    1435:  CIWA protocol ordered shortly after pt's arrival to the  ED. Pt talking on telephone, NAD, resps easy.  Will transfer back to [facility] stable.     Samuel Jester, DO 12/11/14 [phone]

## 2014-12-12 DIAGNOSIS — F10980 Alcohol use, unspecified with alcohol-induced anxiety disorder: Secondary | ICD-10-CM

## 2014-12-12 DIAGNOSIS — F10129 Alcohol abuse with intoxication, unspecified: Secondary | ICD-10-CM | POA: Diagnosis present

## 2014-12-12 DIAGNOSIS — F10180 Alcohol abuse with alcohol-induced anxiety disorder: Secondary | ICD-10-CM

## 2014-12-12 DIAGNOSIS — F1020 Alcohol dependence, uncomplicated: Secondary | ICD-10-CM | POA: Diagnosis present

## 2014-12-12 MED ORDER — SIMVASTATIN 40 MG PO TABS
40.0000 mg | ORAL_TABLET | Freq: Every day | ORAL | Status: DC
  Administered 2014-12-12 – 2014-12-13 (×2): 40 mg via ORAL
  Filled 2014-12-12 (×2): qty 1
  Filled 2014-12-12: qty 2
  Filled 2014-12-12 (×2): qty 1

## 2014-12-12 MED ORDER — MIRTAZAPINE 7.5 MG PO TABS
7.5000 mg | ORAL_TABLET | Freq: Every day | ORAL | Status: DC
  Administered 2014-12-12: 7.5 mg via ORAL
  Filled 2014-12-12 (×4): qty 1

## 2014-12-12 MED ORDER — ACAMPROSATE CALCIUM 333 MG PO TBEC
666.0000 mg | DELAYED_RELEASE_TABLET | Freq: Three times a day (TID) | ORAL | Status: DC
  Administered 2014-12-13 – 2014-12-14 (×5): 666 mg via ORAL
  Filled 2014-12-12 (×4): qty 2
  Filled 2014-12-12 (×2): qty 18
  Filled 2014-12-12: qty 2
  Filled 2014-12-12: qty 18
  Filled 2014-12-12 (×4): qty 2

## 2014-12-12 MED ORDER — IBUPROFEN 600 MG PO TABS: 600.0000 mg | ORAL_TABLET | Freq: Three times a day (TID) | ORAL | Status: DC | PRN

## 2014-12-12 MED ORDER — PANTOPRAZOLE SODIUM 40 MG PO TBEC
40.0000 mg | DELAYED_RELEASE_TABLET | Freq: Every day | ORAL | Status: DC
  Administered 2014-12-13 – 2014-12-14 (×2): 40 mg via ORAL
  Filled 2014-12-12 (×5): qty 1

## 2014-12-12 MED ORDER — IBUPROFEN 600 MG PO TABS
ORAL_TABLET | ORAL | Status: AC
  Administered 2014-12-12: 19:00:00
  Filled 2014-12-12: qty 1

## 2014-12-12 MED ORDER — HYDROXYZINE HCL 50 MG PO TABS
50.0000 mg | ORAL_TABLET | Freq: Every evening | ORAL | Status: DC | PRN
  Administered 2014-12-12: 50 mg via ORAL
  Filled 2014-12-12: qty 1

## 2014-12-12 MED ORDER — MELOXICAM 7.5 MG PO TABS
7.5000 mg | ORAL_TABLET | Freq: Every day | ORAL | Status: DC
  Administered 2014-12-12 – 2014-12-14 (×3): 7.5 mg via ORAL
  Filled 2014-12-12 (×6): qty 1

## 2014-12-12 MED ORDER — ACAMPROSATE CALCIUM 333 MG PO TBEC
333.0000 mg | DELAYED_RELEASE_TABLET | Freq: Three times a day (TID) | ORAL | Status: DC
  Administered 2014-12-12: 333 mg via ORAL
  Filled 2014-12-12 (×3): qty 1

## 2014-12-12 NOTE — H&P (Signed)
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 Alcohol use disorder, severe, dependence [F10.20] 12/12/2014  . Medial tibial stress syndrome [S86.899A] 09/15/2014  . Left hip pain [M25.552] 02/23/2014  . Pulmonary nodule [R91.1] 09/19/2013  . Hyperlipidemia [E78.5] 09/12/2013  . Poison ivy dermatitis [L23.7] 09/12/2013  . Preventive measure [Z41.8] 09/12/2013  . Osteoporosis [M81.0] 09/12/2013  . Smoker [Z72.0] 09/12/2013   History of Present Illness: Ann [name] is an 59 y.o. female that presented to [facility] as a walk-in. She came in with her husband and patient states she had wanted help from her alcosohol dependence.  She states that her depression has worsened over the years and she has become dependent on alcohol to deal with her moods.  She had not seen any mental health professional and been on meds for quite sometime.  She is saddened at the fact that she is not working, she finds herslef unmotivated to care for self and severely anxious getting in her car.   She has also found herself to be more irritable and finds herself yelling at her cats when she gets stressed out.  She is very motivated to get a handle on her addiction and looking forward to getting help.    Elements:  Location:  depression, alcohol abuse. Quality:  helpless, anxious. Duration:  chronic. Context:  see HPI. Associated Signs/Symptoms: Depression Symptoms:  depressed mood, feelings of worthlessness/guilt, difficulty concentrating, hopelessness, anxiety, (Hypo) Manic Symptoms:  Irritable  Mood, Labiality of Mood, Anxiety Symptoms:  Excessive Worry, Psychotic Symptoms:  NA PTSD Symptoms: NA Total Time spent with patient: 45 minutes  Past Medical History:  Past Medical History  Diagnosis Date  . Hyperlipemia   . Osteoporosis   . Degenerative disc disease   . Alcoholism     Past Surgical History  Procedure Laterality Date  . Tubal ligation    . Cystocele repair     Family History:  Family History  Problem Relation Age of Onset  . Cancer Mother     breast  . Cancer Father     prostate   Social History:  History  Alcohol Use  . 4.8 oz/week  . 8 Cans of beer per week    Comment: 8-9 drinks daily     History  Drug Use No    Social History   Social History  . Marital Status: Married    Spouse Name: N/A  . Number of Children: N/A  . Years of Education: N/A   Social History Main Topics  . Smoking status: Current Every Day Smoker -- 0.50 packs/day for 40 years    Types: Cigarettes  . Smokeless tobacco: Never Used  . Alcohol Use: 4.8 oz/week    8 Cans of beer per week     Comment: 8-9 drinks daily  . Drug Use: No  . Sexual Activity: Not Asked   Other Topics Concern  . None   Social History Narrative   Additional Social History:    Pain Medications: see med list Prescriptions: see med list Over the Counter: see med list History of alcohol / drug use?: Yes Longest period of sobriety (when/how long): unknown Negative Consequences of Use:  Personal relationships, Work / Government social research officer Symptoms: Tremors, Irritability, Nausea / Vomiting, Weakness, Patient aware of relationship between substance abuse and physical/medical complications Name of Substance 1: Alcohol 1 - Age of First Use: 16 1 - Amount (size/oz): 8 high alcohol content beers 1 - Frequency: daily 1 - Duration: 2 years 1 - Last Use / Amount: today-1 beer   Musculoskeletal: Strength & Muscle Tone: within normal limits Gait & Station: normal Patient leans: N/A  Psychiatric  Specialty Exam: Physical Exam  Vitals reviewed. Psychiatric: Her mood appears anxious. She exhibits a depressed mood.    ROS  Blood pressure 141/89, pulse 101, temperature 97.9 F (36.6 C), temperature source Oral, resp. rate 18, height 5' 1.42" (1.56 m), weight 66.679 kg (147 lb).Body mass index is 27.4 kg/(m^2).   General Appearance: Casual  Eye Contact:: Fair  Speech: Clear and Coherent  Volume: Normal  Mood: Anxious  Affect: Appropriate  Thought Process: Coherent  Orientation: Full (Time, Place, and Person)  Thought Content: WDL  Suicidal Thoughts: No  Homicidal Thoughts: No  Memory: Immediate; Fair Recent; Fair Remote; Fair  Judgement: Fair  Insight: Fair  Psychomotor Activity: Normal  Concentration: Fair  Recall: [employer] of Knowledge:Fair  Language: Fair  Akathisia: No  Handed: Right  AIMS (if indicated):    Assets: Communication Skills Desire for Improvement  Sleep:    Cognition: WNL  ADL's: Intact        Risk to Self: Suicidal Ideation: No Suicidal Intent: No Is patient at risk for suicide?: No Suicidal Plan?: No Access to Means: No What has been your use of drugs/alcohol within the last 12 months?: daily alcohol use How many times?: 0 Other Self Harm Risks: na-pt denies Triggers for Past Attempts: None known Intentional Self Injurious Behavior: None Risk to Others: Homicidal Ideation: No Thoughts of Harm to Others: No Current Homicidal Intent: No Current Homicidal Plan: No Access to Homicidal Means: No Identified Victim: na-pt denies History of harm to others?: No Assessment of Violence: None Noted Violent Behavior Description: na-pt cooperative Does patient have access to weapons?: No Criminal Charges Pending?: No Does patient have a court date: No Prior Inpatient Therapy: Prior Inpatient Therapy: Yes Prior Therapy Dates: 11 years ago Prior Therapy Facilty/Provider(s): facility in  [place], [place] Reason for Treatment: SA Prior Outpatient Therapy: Prior Outpatient Therapy: Yes Prior Therapy Dates: 11 years ago Prior Therapy Facilty/Provider(s): facility in [place] Reason for Treatment: Med mgnt Does patient have an ACCT team?: No Does patient have Intensive In-House Services?  : No Does patient have [facility] services? : No Does patient have P4CC services?: No  Alcohol Screening: 1. How often do you have a drink containing alcohol?: 4 or more times a week 2. How many drinks containing alcohol do you have on a typical day when you are drinking?: 3 or 4 3. How often do you have six or more drinks on one occasion?: Weekly Preliminary Score: 4 4. How often during the last year have you found that you were not able to stop drinking once you had started?: Less than monthly 5. How often during the last year have you failed to do what was normally expected from you becasue of drinking?: Never 6. How often during the last year have you needed a first drink in the morning to get yourself going after a heavy drinking session?: Weekly 7. How often during the last year have you had a feeling of guilt of remorse after drinking?: Monthly 8. How often during the  last year have you been unable to remember what happened the night before because you had been drinking?: Never 9. Have you or someone else been injured as a result of your drinking?: No 10. Has a relative or friend or a doctor or another health worker been concerned about your drinking or suggested you cut down?: No Alcohol Use Disorder Identification Test Final Score (AUDIT): 14 Brief Intervention: Yes  Allergies:   Allergies  Allergen Reactions  . Naproxen Anaphylaxis, Other (See Comments) and Swelling    Throat Red in the face within seconds   . Tramadol Other (See Comments)    Other reaction(s): Other Severe headaches Nightmares   . Desyrel [Trazodone] Other (See Comments)    Nightmares and sweats   Lab Results:   Results for orders placed or performed during the hospital encounter of 12/11/14 (from the past 48 hour(s))  Comprehensive metabolic panel     Status: Abnormal   Collection Time: 12/11/14  1:18 PM  Result Value Ref Range   Sodium 139 135 - 145 mmol/L   Potassium 3.6 3.5 - 5.1 mmol/L   Chloride 105 101 - 111 mmol/L   CO2 23 22 - 32 mmol/L   Glucose, Bld 94 65 - 99 mg/dL   BUN 11 6 - 20 mg/dL   Creatinine, Ser 0.67 0.44 - 1.00 mg/dL   Calcium 9.4 8.9 - 10.3 mg/dL   Total Protein 8.3 (H) 6.5 - 8.1 g/dL   Albumin 4.7 3.5 - 5.0 g/dL   AST 58 (H) 15 - 41 U/L   ALT 48 14 - 54 U/L   Alkaline Phosphatase 72 38 - 126 U/L   Total Bilirubin 0.3 0.3 - 1.2 mg/dL   GFR calc non Af Amer >60 >60 mL/min   GFR calc Af Amer >60 >60 mL/min    Comment: (NOTE) The eGFR has been calculated using the CKD EPI equation. This calculation has not been validated in all clinical situations. eGFR's persistently <60 mL/min signify possible Chronic Kidney Disease.    Anion gap 11 5 - 15  CBC     Status: None   Collection Time: 12/11/14  1:18 PM  Result Value Ref Range   WBC 8.0 4.0 - 10.5 K/uL   RBC 4.25 3.87 - 5.11 MIL/uL   Hemoglobin 14.3 12.0 - 15.0 g/dL   HCT 41.9 36.0 - 46.0 %   MCV 98.6 78.0 - 100.0 fL   MCH 33.6 26.0 - 34.0 pg   MCHC 34.1 30.0 - 36.0 g/dL   RDW 12.7 11.5 - 15.5 %   Platelets 223 150 - 400 K/uL  Ethanol (ETOH)     Status: Abnormal   Collection Time: 12/11/14  1:19 PM  Result Value Ref Range   Alcohol, Ethyl (B) 72 (H) <5 mg/dL    Comment:        LOWEST DETECTABLE LIMIT FOR SERUM ALCOHOL IS 5 mg/dL FOR MEDICAL PURPOSES ONLY   Urine rapid drug screen (hosp performed) (Not at [facility])     Status: None   Collection Time: 12/11/14  1:20 PM  Result Value Ref Range   Opiates NONE DETECTED NONE DETECTED   Cocaine NONE DETECTED NONE DETECTED   Benzodiazepines NONE DETECTED NONE DETECTED   Amphetamines NONE DETECTED NONE DETECTED   Tetrahydrocannabinol NONE DETECTED NONE DETECTED    Barbiturates NONE DETECTED NONE DETECTED    Comment:        DRUG SCREEN FOR MEDICAL PURPOSES ONLY.  IF CONFIRMATION IS NEEDED FOR ANY PURPOSE,  NOTIFY LAB WITHIN 5 DAYS.        LOWEST DETECTABLE LIMITS FOR URINE DRUG SCREEN Drug Class       Cutoff (ng/mL) Amphetamine      1000 Barbiturate      200 Benzodiazepine   681 Tricyclics       275 Opiates          300 Cocaine          300 THC              50    Current Medications: Current Facility-Administered Medications  Medication Dose Route Frequency Provider Last Rate Last Dose  . acetaminophen (TYLENOL) tablet 650 mg  650 mg Oral Q6H PRN Hampton Abbot, MD   650 mg at 12/12/14 0608  . alum & mag hydroxide-simeth (MAALOX/MYLANTA) 200-200-20 MG/5ML suspension 30 mL  30 mL Oral Q4H PRN Hampton Abbot, MD      . chlordiazePOXIDE (LIBRIUM) capsule 25 mg  25 mg Oral Q6H PRN Hampton Abbot, MD   25 mg at 12/11/14 2322  . chlordiazePOXIDE (LIBRIUM) capsule 25 mg  25 mg Oral QID Hampton Abbot, MD   25 mg at 12/12/14 1208   Followed by  . [START ON 12/13/2014] chlordiazePOXIDE (LIBRIUM) capsule 25 mg  25 mg Oral TID Hampton Abbot, MD       Followed by  . [START ON 12/14/2014] chlordiazePOXIDE (LIBRIUM) capsule 25 mg  25 mg Oral BH-qamhs Hampton Abbot, MD       Followed by  . [START ON 12/16/2014] chlordiazePOXIDE (LIBRIUM) capsule 25 mg  25 mg Oral Daily Hampton Abbot, MD      . hydrOXYzine (ATARAX/VISTARIL) tablet 25 mg  25 mg Oral Q6H PRN Hampton Abbot, MD   25 mg at 12/11/14 2113  . loperamide (IMODIUM) capsule 2-4 mg  2-4 mg Oral PRN Hampton Abbot, MD      . magnesium hydroxide (MILK OF MAGNESIA) suspension 30 mL  30 mL Oral Daily PRN Hampton Abbot, MD      . meloxicam (MOBIC) tablet 7.5 mg  7.5 mg Oral Daily Saramma Eappen, MD      . mirtazapine (REMERON) tablet 7.5 mg  7.5 mg Oral QHS Saramma Eappen, MD      . multivitamin with minerals tablet 1 tablet  1 tablet Oral Daily Hampton Abbot, MD   1 tablet at 12/12/14 [phone]  . nicotine (NICODERM  CQ - dosed in mg/24 hours) patch 21 mg  21 mg Transdermal Daily Nicholaus Bloom, MD   21 mg at 12/12/14 1749  . ondansetron (ZOFRAN-ODT) disintegrating tablet 4 mg  4 mg Oral Q6H PRN Hampton Abbot, MD      . Derrill Memo ON 12/13/2014] pantoprazole (PROTONIX) EC tablet 40 mg  40 mg Oral QAC breakfast Saramma Eappen, MD      . pneumococcal 23 valent vaccine (PNU-IMMUNE) injection 0.5 mL  0.5 mL Intramuscular Tomorrow-1000 Nicholaus Bloom, MD   0.5 mL at 12/12/14 0813  . simvastatin (ZOCOR) tablet 40 mg  40 mg Oral q1800 Saramma Eappen, MD      . thiamine (B-1) injection 100 mg  100 mg Intramuscular Once Hampton Abbot, MD   100 mg at 12/11/14 2115  . thiamine (VITAMIN B-1) tablet 100 mg  100 mg Oral Daily Hampton Abbot, MD   100 mg at 12/12/14 4496   PTA Medications: Prescriptions prior to admission  Medication Sig Dispense Refill Last Dose  . acetaminophen (TYLENOL) 650 MG CR tablet Take 1 tablet (650 mg  total) by mouth every 8 (eight) hours as needed for pain. 90 tablet 3 Past Month at Unknown time  . Calcium Carbonate-Vitamin D 600-400 MG-UNIT per tablet Take 1 tablet by mouth 2 (two) times daily. (Patient not taking: Reported on 12/11/2014) 60 tablet 11 Not Taking at Unknown time  . denosumab (PROLIA) 60 MG/ML SOLN injection Inject 60 mg into the skin every 6 (six) months. Administer in upper arm, thigh, or abdomen 1 Syringe 1 07/2014  . Docusate Calcium (STOOL SOFTENER PO) Take 1 capsule by mouth at bedtime.    12/10/2014 at Unknown time  . meloxicam (MOBIC) 15 MG tablet One tab PO qAM with breakfast for 2 weeks, then daily prn pain. 30 tablet 3 Past Month at Unknown time  . niacin (NIASPAN) 1000 MG CR tablet Take 1 tablet (1,000 mg total) by mouth at bedtime. 30 tablet 3 Past Month at Unknown time  . omeprazole (PRILOSEC) 20 MG capsule Take 1 capsule (20 mg total) by mouth daily. 90 capsule 3 12/10/2014 at Unknown time  . simvastatin (ZOCOR) 40 MG tablet Take 1 tablet (40 mg total) by mouth every evening. 90  tablet 3 Past Week at Unknown time    Previous Psychotropic Medications: Yes   Substance Abuse History in the last 12 months:  Yes.      Consequences of Substance Abuse: NA  Results for orders placed or performed during the hospital encounter of 12/11/14 (from the past 72 hour(s))  Comprehensive metabolic panel     Status: Abnormal   Collection Time: 12/11/14  1:18 PM  Result Value Ref Range   Sodium 139 135 - 145 mmol/L   Potassium 3.6 3.5 - 5.1 mmol/L   Chloride 105 101 - 111 mmol/L   CO2 23 22 - 32 mmol/L   Glucose, Bld 94 65 - 99 mg/dL   BUN 11 6 - 20 mg/dL   Creatinine, Ser 0.67 0.44 - 1.00 mg/dL   Calcium 9.4 8.9 - 10.3 mg/dL   Total Protein 8.3 (H) 6.5 - 8.1 g/dL   Albumin 4.7 3.5 - 5.0 g/dL   AST 58 (H) 15 - 41 U/L   ALT 48 14 - 54 U/L   Alkaline Phosphatase 72 38 - 126 U/L   Total Bilirubin 0.3 0.3 - 1.2 mg/dL   GFR calc non Af Amer >60 >60 mL/min   GFR calc Af Amer >60 >60 mL/min    Comment: (NOTE) The eGFR has been calculated using the CKD EPI equation. This calculation has not been validated in all clinical situations. eGFR's persistently <60 mL/min signify possible Chronic Kidney Disease.    Anion gap 11 5 - 15  CBC     Status: None   Collection Time: 12/11/14  1:18 PM  Result Value Ref Range   WBC 8.0 4.0 - 10.5 K/uL   RBC 4.25 3.87 - 5.11 MIL/uL   Hemoglobin 14.3 12.0 - 15.0 g/dL   HCT 41.9 36.0 - 46.0 %   MCV 98.6 78.0 - 100.0 fL   MCH 33.6 26.0 - 34.0 pg   MCHC 34.1 30.0 - 36.0 g/dL   RDW 12.7 11.5 - 15.5 %   Platelets 223 150 - 400 K/uL  Ethanol (ETOH)     Status: Abnormal   Collection Time: 12/11/14  1:19 PM  Result Value Ref Range   Alcohol, Ethyl (B) 72 (H) <5 mg/dL    Comment:        LOWEST DETECTABLE LIMIT FOR SERUM ALCOHOL IS 5 mg/dL FOR  MEDICAL PURPOSES ONLY   Urine rapid drug screen (hosp performed) (Not at [facility])     Status: None   Collection Time: 12/11/14  1:20 PM  Result Value Ref Range   Opiates NONE DETECTED NONE DETECTED    Cocaine NONE DETECTED NONE DETECTED   Benzodiazepines NONE DETECTED NONE DETECTED   Amphetamines NONE DETECTED NONE DETECTED   Tetrahydrocannabinol NONE DETECTED NONE DETECTED   Barbiturates NONE DETECTED NONE DETECTED    Comment:        DRUG SCREEN FOR MEDICAL PURPOSES ONLY.  IF CONFIRMATION IS NEEDED FOR ANY PURPOSE, NOTIFY LAB WITHIN 5 DAYS.        LOWEST DETECTABLE LIMITS FOR URINE DRUG SCREEN Drug Class       Cutoff (ng/mL) Amphetamine      1000 Barbiturate      200 Benzodiazepine   222 Tricyclics       979 Opiates          300 Cocaine          300 THC              50     Observation Level/Precautions:  15 minute checks  Laboratory:  per ED  Psychotherapy:  group  Medications:  As per medlist  Consultations:  As needed  Discharge Concerns:  safety  Estimated LOS:  5-7 days  Other:     Psychological Evaluations: Yes   Treatment Plan Summary: Admit for crisis management and mood stabilization. Medication management to re-stabilize current mood symptoms.  Remeron 7.5 mg QHS.  Campral 333 mg TID Group counseling sessions for coping skills Medical consults as needed Review and reinstate any pertinent home medications for other health problems  Medical Decision Making:  Review of Psycho-Social Stressors (1), Discuss test with performing physician (1), Decision to obtain old records (1), Review and summation of old records (2) and Independent Review of image, tracing or specimen (2)  I certify that inpatient services furnished can reasonably be expected to improve the patient's condition.   [place]vel AGNP-BC 8/20/20162:02 PM

## 2014-12-12 NOTE — Progress Notes (Signed)
Psychoeducational Group Note  Date:  12/02/2011 Time: 1015  Group Topic/Focus:  Identifying Needs:   The focus of this group is to help patients identify their personal needs that have been historically problematic and identify healthy behaviors to address their needs.  Participation Level:  Active   Participation Quality: good  Affect: flat  Cognitive:  Intact   Insight:  good  Engagement in Group: engaged  Additional Comments:   

## 2014-12-12 NOTE — Progress Notes (Signed)
D.  Pt pleasant on approach, denies complaints at this time.  Positive for evening AA group, interacting appropriately with peers on the unit.  Denies SI/HI/hallucinations at this time.  Minimal signs or symptoms of withdrawal.  A.  Support and encouragement offered, medications given as ordered  R.  Pt remains safe on unit, will continue to monitor.

## 2014-12-12 NOTE — BHH Counselor (Signed)
Attempt to complete PSA with patient was unsuccessful as pt was attending group therapy with CSW staff at 10:55 AM  Catherine C Harrill, LCSW   

## 2014-12-12 NOTE — B[facility] Suicide Risk Assessment (Signed)
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 Alcohol use disorder, severe, dependence [F10.20] 12/12/2014  . Medial tibial stress syndrome [S86.899A] 09/15/2014  . Left hip pain [M25.552] 02/23/2014  . Pulmonary nodule [R91.1] 09/19/2013  . Hyperlipidemia [E78.5] 09/12/2013  . Poison ivy dermatitis [L23.7] 09/12/2013  . Preventive measure [Z41.8] 09/12/2013  . Osteoporosis [M81.0] 09/12/2013  . Smoker [Z72.0] 09/12/2013     Continued Clinical Symptoms:  Alcohol Use Disorder Identification Test Final Score (AUDIT): 14 The "Alcohol Use Disorders Identification Test", Guidelines for Use in Primary Care, Second Edition.  World Science writer [facility]). Score between 0-7:  no or low risk or alcohol related problems. Score between 8-15:  moderate risk of alcohol related problems. Score between 16-19:  high risk of alcohol related problems. Score 20 or above:  warrants further diagnostic evaluation for alcohol dependence and treatment.   CLINICAL FACTORS:   Alcohol/Substance Abuse/Dependencies   Musculoskeletal: Strength & Muscle Tone: within normal limits Gait & Station: normal Patient leans: N/A  Psychiatric Specialty Exam: Physical Exam  Review of Systems  Psychiatric/Behavioral: Positive for substance abuse. The  patient has insomnia.   All other systems reviewed and are negative.   Blood pressure 141/89, pulse 101, temperature 97.9 F (36.6 C), temperature source Oral, resp. rate 18, height 5' 1.42" (1.56 m), weight 66.679 kg (147 lb).Body mass index is 27.4 kg/(m^2).  General Appearance: Casual  Eye Contact::  Fair  Speech:  Clear and Coherent  Volume:  Normal  Mood:  Anxious  Affect:  Appropriate  Thought Process:  Coherent  Orientation:  Full (Time, Place, and Person)  Thought Content:  WDL  Suicidal Thoughts:  No  Homicidal Thoughts:  No  Memory:  Immediate;   Fair Recent;   Fair Remote;   Fair  Judgement:  Fair  Insight:  Fair  Psychomotor Activity:  Normal  Concentration:  Fair  Recall:  [employer] of Knowledge:Fair  Language: Fair  Akathisia:  No  Handed:  Right  AIMS (if indicated):     Assets:  Communication Skills Desire for Improvement  Sleep:     Cognition: WNL  ADL's:  Intact     COGNITIVE FEATURES THAT CONTRIBUTE TO RISK:  Polarized thinking    SUICIDE RISK:   Minimal: No identifiable suicidal ideation.  Patients presenting with no risk factors but with morbid ruminations; may be classified as minimal risk based on the severity of the depressive symptoms  PLAN OF CARE: Patient will benefit from inpatient treatment and stabilization.  Estimated length of stay is 5-7 days.  Reviewed past medical records,treatment plan.  Pt with some anxiety sx as well as sleep issues secondary to alcohol withdrawal. Will start Remeron 7.5 mg po qhs for sleep. Will continue CIWA/Librium protocol. Restart Home medications where indicated.  Will continue to monitor vitals ,medication compliance and treatment side effects while patient is here.  Will monitor for medical issues as well  as call consult as needed.  Reviewed labs  AST/ALT mildly elevated - likely due to alcohol abuse , BAL -72 ( 12/11/14) , uds - neg ,will order as needed.  CSW will start working on disposition.   Patient to participate in therapeutic milieu .       Medical Decision Making:  Review of Psycho-Social Stressors (1), Review or order clinical lab tests (1), Review of Last Therapy Session (1), Review of Medication Regimen & Side Effects (2) and Review of New Medication or Change in Dosage (2)  I certify that inpatient services furnished can reasonably be expected to improve the patient's condition.   Taquanna Borras MD 12/12/2014, 1:57 PM

## 2014-12-12 NOTE — B[facility] Group Notes (Signed)
[facility] Group Notes:  (Clinical Social Work)  12/12/2014     10-11AM  Summary of Progress/Problems:   The main focus of today's process group was to learn how to use a decisional balance exercise to move forward in the Stages of Change, which were described and discussed.  Patients listed needs on the whiteboard and unhealthy coping techniques often used to fill needs.  Motivational Interviewing and the whiteboard were utilized to help patients explore in depth the perceived benefits and costs of unhealthy coping techniques, as well as the  benefits and costs of replacing that with a healthy coping skills.  A handout was distributed for patients to be able to do this exercise for themselves.   The patient expressed that their own unhealthy coping involves drinking alcohol and isolating herself.  She was very involved in the discussion and showed good insight.  Type of Therapy:  Group Therapy - Process   Participation Level:  Active  Participation Quality:  Appropriate, Attentive, Sharing and Supportive  Affect:  Appropriate  Cognitive:  Alert, Appropriate and Oriented  Insight:  Engaged  Engagement in Therapy:  Engaged  Modes of Intervention:  Education, Motivational Interviewing  Ambrose Mantle, LCSW 12/12/2014, 12:47 PM
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
## 2014-12-12 NOTE — Progress Notes (Signed)
Adult Psychoeducational Group Note  Date:  12/12/2014 Time:  8:00PM Group Topic/Focus:  Wrap-Up Group:   The focus of this group is to help patients review their daily goal of treatment and discuss progress on daily workbooks.  Participation Level:  Active  Participation Quality:  Appropriate and Attentive  Affect:  Appropriate  Cognitive:  Alert and Appropriate  Insight: Appropriate  Engagement in Group:  Engaged  Modes of Intervention:  Discussion  Additional Comments:  Pt. Was attentive and appropriate during tonight's wrap up group meeting with AA.   Bing Plume D 12/12/2014, 10:36 PM

## 2014-12-12 NOTE — BHH Group Notes (Signed)
[facility] Group Notes:  (Nursing/MHT/Case Management/Adjunct)  Date:  12/12/2014  Time:  10:10 AM  Type of Therapy:  Nurse Education  /  Goals Group: the group is focused on teaching patients how to set attainable goals as well as develop skills needed to accomplish these goals.  Participation Level:  Active  Participation Quality:  Appropriate  Affect:  Appropriate  Cognitive:  Appropriate  Insight:  Appropriate  Engagement in Group:  Engaged  Modes of Intervention:  Education  Summary of Progress/Problems:  Ann [name] 12/12/2014, 10:10 AM

## 2014-12-13 MED ORDER — SERTRALINE HCL 25 MG PO TABS
25.0000 mg | ORAL_TABLET | Freq: Every day | ORAL | Status: DC
  Administered 2014-12-13 – 2014-12-14 (×2): 25 mg via ORAL
  Filled 2014-12-13: qty 3
  Filled 2014-12-13 (×3): qty 1

## 2014-12-13 MED ORDER — HYDROXYZINE HCL 50 MG PO TABS
50.0000 mg | ORAL_TABLET | Freq: Every evening | ORAL | Status: DC | PRN
  Administered 2014-12-13: 50 mg via ORAL
  Filled 2014-12-13: qty 1
  Filled 2014-12-13: qty 3

## 2014-12-13 MED ORDER — MIRTAZAPINE 7.5 MG PO TABS: 3.7500 mg | ORAL_TABLET | Freq: Every day | ORAL | Status: DC

## 2014-12-13 MED ORDER — SERTRALINE HCL 25 MG PO TABS
ORAL_TABLET | ORAL | Status: AC
  Filled 2014-12-13: qty 1

## 2014-12-13 MED ORDER — MIRTAZAPINE 7.5 MG PO TABS: 3.7500 mg | ORAL_TABLET | Freq: Every evening | ORAL | Status: DC | PRN

## 2014-12-13 MED ORDER — HYDROXYZINE HCL 50 MG PO TABS: 50.0000 mg | ORAL_TABLET | Freq: Every evening | ORAL | Status: DC | PRN

## 2014-12-13 NOTE — Progress Notes (Signed)
Patient did attend the evening speaker AA meeting.  

## 2014-12-13 NOTE — Plan of Care (Signed)
Problem: Alteration in mood & ability to function due to Goal: STG-Patient will attend groups Outcome: Progressing Pt has attended group this evening      

## 2014-12-13 NOTE — Progress Notes (Signed)
D) Pt has been attending the groups and interacting with her peers appropriately. Affect and mood are improving. Pt states she is learning to be able to express herself and to also realize she is in control and not others in her life. States she is learning a lot and realizes that she has a long road ahead of her, but thinks its worth the time and energy. Pt rates her depression as a 2, hopelessness as a 0 and her anxiety at a 2. A) given support, reassurance and praise along with encouagment. Provided with a 1:1. Frequent contact throughout the day. Praise and given. R) Pt denies SI and HI.

## 2014-12-13 NOTE — B[facility] Group Notes (Signed)
[facility] Group Notes:  (Clinical Social Work)  12/13/2014  10:00-11:00AM  Summary of Progress/Problems:   The main focus of today's process group was to   1)  discuss the importance of adding supports  2)  define health supports versus unhealthy supports  3)  identify the patient's current unhealthy supports and plan how to handle them  4)  Identify the patient's current healthy supports and plan what to add.  An emphasis was placed on using counselor, doctor, therapy groups, 12-step groups, and problem-specific support groups to expand supports.    The patient expressed full comprehension of the concepts presented, and agreed that there is a need to add more supports.  The patient stated her husband, three sons aged 39/35/30, mother are all current supports while what she is wanting to put in place are support groups, doctor, AA and a sponsor when she gets out of hospital.  She stated she understands about needing to make a plan but it is pretty overwhelming, and asked what to do first.  Group discussed this.  Type of Therapy:  Process Group with Motivational Interviewing  Participation Level:  Active  Participation Quality:  Appropriate, Attentive, Sharing and Supportive  Affect:  Appropriate  Cognitive:  Alert, Appropriate and Oriented  Insight:  Engaged  Engagement in Therapy:  Engaged  Modes of Intervention:   Education, Support and Processing, Activity  Ann Mantle, LCSW 12/13/2014

## 2014-12-13 NOTE — Progress Notes (Signed)
Psychoeducational Group Note  Date:  12/13/2014 Time: 0930  Group Topic/Focus:  Making Healthy Choices:   The focus of this group is to help patients identify negative/unhealthy choices they were using prior to admission and identify positive/healthier coping strategies to replace them upon discharge.  Participation Level:  Active  Participation Quality:  Appropriate  Affect:  Appropriate  Cognitive:  Appropriate  Insight:  Engaged  Engagement in Group:  Engaged  Additional Comments:    Rich Brave 7:47 PM. 12/13/2014

## 2014-12-13 NOTE — Progress Notes (Signed)
D.  Pt pleasant on approach, denies complaints at this time.  Discussed sleep medication with patient since Remeron was discontinued today and Pt states she would like to have Vistaril and Librium tonight for anxiety and insomnia.  Pt is hopeful she will not feel so sleepy tomorrow with this combination.  Denies SI/HI/hallucinations at this time.  Positive for evening AA group, interacting appropriately with peers on the unit.  A.  Support and encouragement offered, medications given as ordered  R.  Pt remains safe on the unit, will continue to monitor.

## 2014-12-13 NOTE — Progress Notes (Signed)
[facility] MD Progress Note  12/13/2014 5:06 PM Ann [name]  MRN:  [id number] Subjective:  Patient states that she is too sleepy today.  She is thinking that the Remeron may be too strong.  Otherwise, she is feeling well.  No other reported ADR's.   Interacts well with others.  Denies withdrawal symptoms.  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 Alcohol use disorder, severe, dependence [F10.20] 12/12/2014  . Medial tibial stress syndrome [S86.899A] 09/15/2014  . Left hip pain [M25.552] 02/23/2014  . Pulmonary nodule [R91.1] 09/19/2013  . Hyperlipidemia [E78.5] 09/12/2013  . Poison ivy dermatitis [L23.7] 09/12/2013  . Preventive measure [Z41.8] 09/12/2013  . Osteoporosis [M81.0] 09/12/2013  . Smoker [Z72.0] 09/12/2013   Total Time spent with patient: 45 minutes   Past Medical History:  Past Medical History  Diagnosis Date  . Hyperlipemia   . Osteoporosis   . Degenerative disc disease   . Alcoholism     Past Surgical History  Procedure Laterality Date  . Tubal ligation    . Cystocele repair     Family History:  Family History  Problem Relation Age of Onset  . Cancer Mother     breast  . Cancer Father     prostate   Social History:  History  Alcohol Use  . 4.8 oz/week  . 8 Cans of beer per week    Comment: 8-9 drinks daily     History  Drug Use No    Social History   Social History  . Marital Status: Married    Spouse Name: N/A  . Number of Children: N/A  . Years of Education: N/A   Social History Main Topics  . Smoking status: Current Every Day Smoker -- 0.50 packs/day for 40 years    Types: Cigarettes  . Smokeless tobacco: Never Used  . Alcohol Use: 4.8 oz/week    8 Cans of beer per week     Comment: 8-9 drinks daily   . Drug Use: No  . Sexual Activity: Not Asked   Other Topics Concern  . None   Social History Narrative   Additional History:    Sleep: Good  Appetite:  Good   Assessment: see above diagnosis  Musculoskeletal: Strength & Muscle Tone: within normal limits Gait & Station: normal Patient leans: N/A   Psychiatric Specialty Exam: Physical Exam  Vitals reviewed.   Review of Systems  All other systems reviewed and are negative.   Blood pressure 112/78, pulse 89, temperature 97.5 F (36.4 C), temperature source Oral, resp. rate 18, height 5' 1.42" (1.56 m), weight 66.679 kg (147 lb).Body mass index is 27.4 kg/(m^2).   General Appearance: Casual  Eye Contact:: Fair  Speech: Clear and Coherent  Volume: Normal  Mood: Anxious  Affect: Appropriate  Thought Process: Coherent  Orientation: Full (Time, Place, and Person)  Thought Content: WDL  Suicidal Thoughts: No  Homicidal Thoughts: No  Memory: Immediate; Fair Recent; Fair Remote; Fair  Judgement: Fair  Insight: Fair  Psychomotor Activity: Normal  Concentration: Fair  Recall: [employer] of Knowledge:Fair  Language: Fair  Akathisia: No  Handed: Right  AIMS (if indicated):    Assets: Communication Skills Desire for Improvement  Sleep:    Cognition: WNL  ADL's: Intact  Current Medications: Current Facility-Administered Medications  Medication Dose Route Frequency Provider Last Rate Last Dose  . acamprosate (CAMPRAL) tablet 666 mg  666 mg Oral TID WC Rachael Fee, MD   666 mg at 12/13/14 1206  . alum & mag hydroxide-simeth (MAALOX/MYLANTA) 200-200-20 MG/5ML suspension 30 mL  30 mL Oral Q4H PRN Nelly Rout, MD      . chlordiazePOXIDE (LIBRIUM) capsule 25 mg  25 mg Oral Q6H PRN Nelly Rout, MD   25 mg at 12/11/14 2322  . chlordiazePOXIDE (LIBRIUM) capsule 25 mg  25 mg Oral TID Nelly Rout, MD   25 mg at 12/13/14 1206   Followed by  . [START ON  12/14/2014] chlordiazePOXIDE (LIBRIUM) capsule 25 mg  25 mg Oral BH-qamhs Nelly Rout, MD       Followed by  . [START ON 12/16/2014] chlordiazePOXIDE (LIBRIUM) capsule 25 mg  25 mg Oral Daily Nelly Rout, MD      . hydrOXYzine (ATARAX/VISTARIL) tablet 25 mg  25 mg Oral Q6H PRN Nelly Rout, MD   25 mg at 12/11/14 2113  . hydrOXYzine (ATARAX/VISTARIL) tablet 50 mg  50 mg Oral QHS PRN Adonis Brook, NP   50 mg at 12/12/14 2134  . loperamide (IMODIUM) capsule 2-4 mg  2-4 mg Oral PRN Nelly Rout, MD      . magnesium hydroxide (MILK OF MAGNESIA) suspension 30 mL  30 mL Oral Daily PRN Nelly Rout, MD      . meloxicam (MOBIC) tablet 7.5 mg  7.5 mg Oral Daily Saramma Eappen, MD   7.5 mg at 12/13/14 0815  . mirtazapine (REMERON) tablet 7.5 mg  7.5 mg Oral QHS Saramma Eappen, MD   7.5 mg at 12/12/14 2137  . multivitamin with minerals tablet 1 tablet  1 tablet Oral Daily Nelly Rout, MD   1 tablet at 12/13/14 0815  . nicotine (NICODERM CQ - dosed in mg/24 hours) patch 21 mg  21 mg Transdermal Daily Rachael Fee, MD   21 mg at 12/13/14 4098  . ondansetron (ZOFRAN-ODT) disintegrating tablet 4 mg  4 mg Oral Q6H PRN Nelly Rout, MD      . pantoprazole (PROTONIX) EC tablet 40 mg  40 mg Oral QAC breakfast Jomarie Longs, MD   40 mg at 12/13/14 0602  . pneumococcal 23 valent vaccine (PNU-IMMUNE) injection 0.5 mL  0.5 mL Intramuscular Tomorrow-1000 Rachael Fee, MD   0.5 mL at 12/12/14 0813  . simvastatin (ZOCOR) tablet 40 mg  40 mg Oral q1800 Jomarie Longs, MD   40 mg at 12/12/14 1846  . thiamine (B-1) injection 100 mg  100 mg Intramuscular Once Nelly Rout, MD   100 mg at 12/11/14 2115  . thiamine (VITAMIN B-1) tablet 100 mg  100 mg Oral Daily Nelly Rout, MD   100 mg at 12/13/14 0815    Lab Results: No results found for this or any previous visit (from the past 48 hour(s)).  Physical Findings: AIMS: Facial and Oral Movements Muscles of Facial Expression: None, normal Lips and Perioral  Area: None, normal Jaw: None, normal Tongue: None, normal,Extremity Movements Upper (arms, wrists, hands, fingers): None, normal Lower (legs, knees, ankles, toes): None, normal, Trunk Movements Neck, shoulders, hips: None, normal, Overall Severity Severity of abnormal movements (highest score from questions above): None, normal Incapacitation due to abnormal movements: None, normal Patient's awareness of abnormal movements (rate only patient's report): No Awareness, Dental Status Current problems with teeth and/or dentures?: No Does patient usually wear dentures?: No  CIWA:  CIWA-Ar Total: 0 COWS:     Treatment Plan Summary: Patient will benefit from inpatient treatment and stabilization.  Estimated length of stay is 5-7 days.  Reviewed past medical records,treatment plan.  Pt with some anxiety sx as well as sleep issues secondary to alcohol withdrawal.  DC Remeron 7.5 mg po qhs for sleep.  Patient states that she felt somnolent on it.  She was on Zoloft in the past with good effect will still desires help with insomnia.  Concerned that Remeron and Zoloft may be too much SSRI's.  Initially ordered 5 mg nut epic does not recognize dose and only allows 3.75 mg.  Please monitor and advise.    Will continue CIWA/Librium protocol. Restart Home medications where indicated.  Will continue to monitor vitals ,medication compliance and treatment side effects while patient is here.  Will monitor for medical issues as well as call consult as needed.  Reviewed labs AST/ALT mildly elevated - likely due to alcohol abuse , BAL -72 ( 12/11/14) , uds - neg ,will order as needed.  CSW will start working on disposition.  Patient to participate in therapeutic milieu .    Medical Decision Making:  Review of Psycho-Social Stressors (1), Discuss test with performing physician (1), Review and summation of old records (2), Review of Medication Regimen & Side Effects (2) and Review of New Medication or  Change in Dosage (2)   Velna Hatchet May Romana Deaton AGNP-BC 12/13/2014, 5:06 PM

## 2014-12-13 NOTE — Progress Notes (Signed)
Psychoeducational Group Note  Date:  12/13/2014 Time: 1015 Group Topic/Focus:  Making Healthy Choices:   The focus of this group is to help patients identify negative/unhealthy choices they were using prior to admission and identify positive/healthier coping strategies to replace them upon discharge.  Participation Level:  Active  Participation Quality:  Appropriate  Affect:  Appropriate  Cognitive:  Alert  Insight:  Engaged  Engagement in Group:  Engaged  Additional Comments:    PDuke RN BC 7:06 PM. 12/13/2014

## 2014-12-14 MED ORDER — OMEPRAZOLE 20 MG PO CPDR: 20.0000 mg | DELAYED_RELEASE_CAPSULE | Freq: Every day | ORAL | Status: DC

## 2014-12-14 MED ORDER — THIAMINE HCL 100 MG PO TABS: 100.0000 mg | ORAL_TABLET | Freq: Every day | ORAL | Status: DC

## 2014-12-14 MED ORDER — SERTRALINE HCL 50 MG PO TABS: 50.0000 mg | ORAL_TABLET | Freq: Every day | ORAL | Status: DC

## 2014-12-14 MED ORDER — NIACIN ER (ANTIHYPERLIPIDEMIC) 1000 MG PO TBCR: 1000.0000 mg | EXTENDED_RELEASE_TABLET | Freq: Every day | ORAL | Status: DC

## 2014-12-14 MED ORDER — NICOTINE 21 MG/24HR TD PT24: 21.0000 mg | MEDICATED_PATCH | Freq: Every day | TRANSDERMAL | Status: DC

## 2014-12-14 MED ORDER — ACAMPROSATE CALCIUM 333 MG PO TBEC: 666.0000 mg | DELAYED_RELEASE_TABLET | Freq: Three times a day (TID) | ORAL | Status: DC

## 2014-12-14 MED ORDER — HYDROXYZINE HCL 50 MG PO TABS: 50.0000 mg | ORAL_TABLET | Freq: Every evening | ORAL | Status: DC | PRN

## 2014-12-14 MED ORDER — SERTRALINE HCL 50 MG PO TABS
50.0000 mg | ORAL_TABLET | Freq: Every day | ORAL | Status: DC
  Filled 2014-12-14: qty 3

## 2014-12-14 MED ORDER — SIMVASTATIN 40 MG PO TABS: 40.0000 mg | ORAL_TABLET | Freq: Every evening | ORAL | Status: DC

## 2014-12-14 MED ORDER — ADULT MULTIVITAMIN W/MINERALS CH: 1.0000 | ORAL_TABLET | Freq: Every day | ORAL | Status: AC

## 2014-12-14 MED ORDER — MELOXICAM 7.5 MG PO TABS: 7.5000 mg | ORAL_TABLET | Freq: Every day | ORAL | Status: DC

## 2014-12-14 MED ORDER — DENOSUMAB 60 MG/ML ~~LOC~~ SOLN: 60.0000 mg | SUBCUTANEOUS | Status: DC

## 2014-12-14 NOTE — BHH Suicide Risk Assessment (Signed)
[facility] Discharge Suicide Risk Assessment   Demographic Factors:  Caucasian  Total Time spent with patient: 30 minutes  Musculoskeletal: Strength & Muscle Tone: within normal limits Gait & Station: normal Patient leans: normal  Psychiatric Specialty Exam: Physical Exam  Review of Systems  Constitutional: Negative.   HENT: Negative.   Eyes: Negative.   Respiratory: Negative.   Cardiovascular: Negative.   Gastrointestinal: Negative.   Genitourinary: Negative.   Musculoskeletal: Positive for joint pain.  Skin: Negative.   Neurological: Negative.   Endo/Heme/Allergies: Negative.   Psychiatric/Behavioral: Positive for substance abuse.    Blood pressure 126/80, pulse 92, temperature 98.3 F (36.8 C), temperature source Oral, resp. rate 18, height 5' 1.42" (1.56 m), weight 66.679 kg (147 lb).Body mass index is 27.4 kg/(m^2).  General Appearance: Fairly Groomed  Patent attorney::  Fair  Speech:  Clear and Coherent409  Volume:  Normal  Mood:  worried  Affect:  Appropriate  Thought Process:  Coherent and Goal Directed  Orientation:  Full (Time, Place, and Person)  Thought Content:  plans as she moves on  Suicidal Thoughts:  No  Homicidal Thoughts:  No  Memory:  Immediate;   Fair Recent;   Fair Remote;   Fair  Judgement:  Fair  Insight:  Present  Psychomotor Activity:  Normal  Concentration:  Fair  Recall:  [employer] of Knowledge:Fair  Language: Fair  Akathisia:  No  Handed:  Right  AIMS (if indicated):     Assets:  Desire for Improvement Housing Social Support  Sleep:  Number of Hours: 6  Cognition: WNL  ADL's:  Intact   Have you used any form of tobacco in the last 30 days? (Cigarettes, Smokeless Tobacco, Cigars, and/or Pipes): Yes  Has this patient used any form of tobacco in the last 30 days? (Cigarettes, Smokeless Tobacco, Cigars, and/or Pipes) Yes, Prescription not provided because: nocotine patches will be given  Mental Status Per Nursing Assessment::   On  Admission:  NA  Current Mental Status by Physician: In full contact with reality. There are no active S/S of withdrawal. No active SI plans or intent. States she was under a lot of stress dealing with a relative who is mentally ill that they allow to stay at their house and tried to kill herself couple of times. He admits he increased her alcohol use and became out of control. She states she feels so much better clear headed. She is motivated to come to the CD IOP so she can continue to work with her anxiety and pursue long term sobriety.  Loss Factors: NA  Historical Factors: NA  Risk Reduction Factors:   Sense of responsibility to family, Living with another person, especially a relative and Positive social support  Continued Clinical Symptoms:  Alcohol/Substance Abuse/Dependencies  Cognitive Features That Contribute To Risk:  None    Suicide Risk:  Minimal: No identifiable suicidal ideation.  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 Alcohol use disorder, severe, dependence [F10.20] 12/12/2014  . Medial tibial stress syndrome [S86.899A] 09/15/2014  . Left hip pain [M25.552] 02/23/2014  . Pulmonary nodule [R91.1] 09/19/2013  . Hyperlipidemia [E78.5] 09/12/2013  . Poison  ivy dermatitis [L23.7] 09/12/2013  . Preventive measure [Z41.8] 09/12/2013  . Osteoporosis [M81.0] 09/12/2013  . Smoker [Z72.0] 09/12/2013    Follow-up Information    Follow up with [facility] On 12/15/2014.   Why:  Appt on 12/15/14 for orientation at 2:30PM with Dewayne Hatch  to get set up with [facility]. Please bring completed orientation packet with you to this appt. Thank you!    Contact information:   [address] [place], [place] [address] Phone: [phone] Fax: [phone]      Plan Of Care/Follow-up recommendations:  Activity:  as tolerated Diet:  regular Follow up [facility] as above Is patient on multiple antipsychotic therapies at discharge:  No   Has Patient had three or more failed trials of antipsychotic monotherapy by history:  No  Recommended Plan for Multiple Antipsychotic Therapies: NA    Ambra Haverstick A 12/14/2014, 12:44 PM

## 2014-12-14 NOTE — Plan of Care (Signed)
Problem: Consults Goal: Substance Abuse Patient Education See Patient Education Module for education specifics.  Outcome: Progressing Patient feel ready and motivated to start outpatient therapy and AA.  She denies any withdrawal symptoms.

## 2014-12-14 NOTE — Progress Notes (Signed)
  [facility] Adult Case Management Discharge Plan :  Will you be returning to the same living situation after discharge:  Yes,  home At discharge, do you have transportation home?: Yes,  family member Do you have the ability to pay for your medications: Yes,  [employer] private insurance  Release of information consent forms completed and submitted to medical records by CSW.  Patient to Follow up at: Follow-up Information    Follow up with [facility] On 12/15/2014.   Why:  Appt on 12/15/14 for orientation at 2:30PM with Dewayne Hatch to get set up with [facility]. Please bring completed orientation packet with you to this appt. Thank you!    Contact information:   [address] [place], [place] [address] Phone: [phone] Fax: [phone]      Patient denies SI/HI: Yes,  during admission/self report.    Safety Planning and Suicide Prevention discussed: Yes,  SPE completed with pt, although not needed, as pt did not endorse SI during admission or during stay.  Have you used any form of tobacco in the last 30 days? (Cigarettes, Smokeless Tobacco, Cigars, and/or Pipes): Yes  Has patient been referred to the Quitline?: Patient refused referral  Ann [name] 12/14/2014, 11:08 AM

## 2014-12-14 NOTE — Progress Notes (Signed)
D: Patient denies any withdrawal symptoms today.  Her goal is to "listen and learn.  Keep working on my release plan and hopefully go home."  Patient states, "I am ready and motivated to start outpatient therapy and AA."  She states she is looking forward to "being sober" and herself again.  She denies SI/HI/AVH.  Patient is attending groups and participating. A: Continue to monitor medication management and MD orders.  Safety checks completed every 15 minutes per protocol.  Offer support and encouragement as needed. R: Patient's behavior is appropriate to situation.

## 2014-12-14 NOTE — Plan of Care (Signed)
Problem: Alteration in mood & ability to function due to Goal: STG-Patient will attend groups Outcome: Progressing Pt has attended AA group all weekend with appropriate participation

## 2014-12-14 NOTE — Tx Team (Signed)
Interdisciplinary Treatment Plan Update (Adult)  Date:  12/14/2014  Time Reviewed:  8:39 AM   Progress in Treatment: Attending groups: Yes. Participating in groups:  Yes. Taking medication as prescribed:  Yes. Tolerating medication:  Yes. Family/Significant othe contact made:  SPE not required for this pt. SPI pamphlet provided.  Patient understands diagnosis:  Yes. and As evidenced by:  seeking treatment for ETOH detox, depression, med stabilization. Discussing patient identified problems/goals with staff:  Yes. Medical problems stabilized or resolved:  Yes. Denies suicidal/homicidal ideation: Yes. Issues/concerns per patient self-inventory:  Other:  New problem(s) identified:    Discharge Plan or Barriers:   Reason for Continuation of Hospitalization: none  Comments:  Ann [name] is an 59 y.o. female that presented to [facility] as a walk-in. She came in with her husband and patient states she had wanted help from her alcosohol dependence. She states that her depression has worsened over the years and she has become dependent on alcohol to deal with her moods. She had not seen any mental health professional and been on meds for quite sometime. She is saddened at the fact that she is not working, she finds herslef unmotivated to care for self and severely anxious getting in her car. She has also found herself to be more irritable and finds herself yelling at her cats when she gets stressed out. She is very motivated to get a handle on her addiction and looking forward to getting help.    Estimated length of stay:  D/c today   Additional Comments:  Patient and CSW reviewed pt's identified goals and treatment plan. Patient verbalized understanding and agreed to treatment plan. CSW reviewed [facility] "Discharge Process and Patient Involvement" Form. Pt verbalized understanding of information provided and signed form.    Review of initial/current patient goals per problem list:  1.  Goal(s): Patient will participate in aftercare plan  Met: Yes   Target date: at discharge  As evidenced by: Patient will participate within aftercare plan AEB aftercare Ifeoluwa Beller and housing plan at discharge being identified.  8/22: Pt set up with [facility] with Brandon Melnick. Orientation tomorrow. Goal met.   2. Goal (s): Patient will exhibit decreased depressive symptoms and suicidal ideations.  Met: Yes    Target date: at discharge  As evidenced by: Patient will utilize self rating of depression at 3 or below and demonstrate decreased signs of depression or be deemed stable for discharge by MD.  8/22: Pt rates depression as 1/10 and presents with pleasant mood and calm affect. Goal met.   3. Goal(s): Patient will demonstrate decreased signs of withdrawal due to substance abuse  Met:Yes   Target date:at discharge   As evidenced by: Patient will produce a CIWA/COWS score of 0, have stable vitals signs, and no symptoms of withdrawal.  8/22: Pt reports no signs of withdrawal with CIWA of 0 and stable vitals. Goal met.    Attendees: Patient:   12/14/2014 8:39 AM   Family:   12/14/2014 8:39 AM   Physician:  Dr. Carlton Adam, MD 12/14/2014 8:39 AM   Nursing:   Delia Chimes; [place] RN 12/14/2014 8:39 AM   Clinical Social Worker: Maxie Better, [place]  12/14/2014 8:39 AM   Clinical Social Worker: Erasmo Downer Drinkard LCSWA; Peri Maris LCSWA 12/14/2014 8:39 AM   Other:  Gerline Legacy Nurse Case Manager 12/14/2014 8:39 AM   Other:  Lucinda Dell; [facility] T[facility]  12/14/2014 8:39 AM   Other:   12/14/2014 8:39 AM   Other:  12/14/2014  8:39 AM   Other:  12/14/2014 8:39 AM   Other:  12/14/2014 8:39 AM    12/14/2014 8:39 AM    12/14/2014 8:39 AM    12/14/2014 8:39 AM    12/14/2014 8:39 AM    Scribe for Treatment Team:   Maxie Better, [place]  12/14/2014 8:39 AM

## 2014-12-14 NOTE — Progress Notes (Signed)
Recreation Therapy Notes  Date: 08.22.2016 Time: 08.22.2016 Location: 300 Hall Dayroom   Group Topic: Stress Management  Goal Area(s) Addresses:  Patient will actively participate in stress management techniques presented during session.   Behavioral Response: Appropriate, Engaged, Attentive  Intervention: Stress management techniques  Activity :  Deep Breathing and Guided Imagery. LRT provided instruction and demonstration on practice of Guided Imagery. Technique was coupled with deep breathing.   Education:  Stress Management, Discharge Planning.   Education Outcome: Acknowledges edcuation  Clinical Observations/Feedback: Patient actively engaged in techniques presented, expressing no concerns and expressing ability to practice independently post d/c.    Tomika Eckles L Seher Schlagel, LRT/CTRS        Nakiea Metzner L 12/14/2014 2:04 PM 

## 2014-12-14 NOTE — [facility] Suicide Risk Assessment (Signed)
[facility] INPATIENT: Family/Significant Other Suicide Prevention Education  Suicide Prevention Education:  Education Completed; No one has been identified by the patient as the family member/significant other with whom the patient will be residing, and identified as the person(s) who will aid the patient in the event of a mental health crisis (suicidal ideations/suicide attempt).   Pt did not c/o SI at admission, nor have they endorsed SI during their stay here. SPE not required. SPI pamphlet provided to pt and they were encouraged to share information with support network, ask questions, and talk about any concerns relating to SPE.  [employer], LCSWA 12/14/2014 11:08 AM

## 2014-12-14 NOTE — Plan of Care (Signed)
Problem: Consults Goal: Substance Abuse Patient Education See Patient Education Module for education specifics.  Outcome: Progressing Patient report minimal withdrawal symptoms.

## 2014-12-14 NOTE — Discharge Summary (Signed)
Physician Discharge Summary Note  Patient:  Ann [name] is an 59 y.o., female MRN:  [id number] DOB:  12/06/1955 Patient phone:  [phone] (home)  Patient address:   [address] 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 Alcohol use disorder, severe, dependence [F10.20] 12/12/2014  . Medial tibial stress syndrome [S86.899A] 09/15/2014  . Left hip pain [M25.552] 02/23/2014  . Pulmonary nodule [R91.1] 09/19/2013  . Hyperlipidemia [E78.5] 09/12/2013  . Poison ivy dermatitis [L23.7] 09/12/2013  . Preventive measure [Z41.8] 09/12/2013  . Osteoporosis [M81.0] 09/12/2013  . Smoker [Z72.0] 09/12/2013    Musculoskeletal: Strength & Muscle Tone: within normal limits Gait & Station: normal Patient leans: N/A  Psychiatric Specialty Exam: Physical Exam  Psychiatric: She has a normal mood and affect. Her speech is normal and behavior is normal. Judgment and thought content normal. Cognition and memory are normal.    Review of Systems  Constitutional: Negative.   HENT: Negative.   Eyes: Negative.   Respiratory: Negative.   Cardiovascular: Negative.   Gastrointestinal: Negative.   Genitourinary: Negative.   Musculoskeletal: Negative.   Skin: Negative.   Neurological: Negative.   Endo/Heme/Allergies: Negative.   Psychiatric/Behavioral: Positive for substance abuse (Alcohol abuse prior to admission). Negative for depression, suicidal ideas, hallucinations and memory loss. The patient is nervous/anxious (Stabilized with treatments). The patient does not  have insomnia.     Blood pressure 126/80, pulse 92, temperature 98.3 F (36.8 C), temperature source Oral, resp. rate 18, height 5' 1.42" (1.56 m), weight 66.679 kg (147 lb).Body mass index is 27.4 kg/(m^2).  See Physician SRA     Have you used any form of tobacco in the last 30 days? (Cigarettes, Smokeless Tobacco, Cigars, and/or Pipes): Yes  Has this patient used any form of tobacco in the last 30 days? (Cigarettes, Smokeless Tobacco, Cigars, and/or Pipes) Yes, Prescription not provided because: nocotine patches will be given  Past Medical History:  Past Medical History  Diagnosis Date  . Hyperlipemia   . Osteoporosis   . Degenerative disc disease   . Alcoholism     Past Surgical History  Procedure Laterality Date  . Tubal ligation    . Cystocele repair     Family History:  Family History  Problem Relation Age of Onset  . Cancer Mother     breast  . Cancer Father     prostate   Social History:  History  Alcohol Use  . 4.8 oz/week  . 8 Cans of beer per week    Comment: 8-9 drinks daily     History  Drug Use No    Social History   Social History  . Marital Status: Married    Spouse Name: N/A  . Number of Children: N/A  . Years of Education: N/A   Social History Main Topics  . Smoking status: Current Every Day Smoker -- 0.50 packs/day for 40 years    Types: Cigarettes  . Smokeless tobacco: Never Used  . Alcohol Use: 4.8 oz/week    8 Cans of beer per week     Comment: 8-9 drinks daily  . Drug Use: No  . Sexual  Activity: Not Asked   Other Topics Concern  . None   Social History Narrative   Risk to Self: Suicidal Ideation: No Suicidal Intent: No Is patient at risk for suicide?: No Suicidal Plan?: No Access to Means: No What has been your use of drugs/alcohol within the last 12 months?: Daily alcohol use of 8-9 Bud Platinum (high alcohol content) How many times?: 0 Other Self Harm Risks: na-pt denies Triggers for Past Attempts: None  known Intentional Self Injurious Behavior: None Risk to Others: Homicidal Ideation: No Thoughts of Harm to Others: No Current Homicidal Intent: No Current Homicidal Plan: No Access to Homicidal Means: No Identified Victim: na-pt denies History of harm to others?: No Assessment of Violence: None Noted Violent Behavior Description: na-pt cooperative Does patient have access to weapons?: No Criminal Charges Pending?: No Does patient have a court date: No Prior Inpatient Therapy: Prior Inpatient Therapy: Yes Prior Therapy Dates: 11 years ago Prior Therapy Facilty/Provider(s): facility in [place], [place] Reason for Treatment: SA Prior Outpatient Therapy: Prior Outpatient Therapy: Yes Prior Therapy Dates: 11 years ago Prior Therapy Facilty/Provider(s): facility in [place] Reason for Treatment: Med mgnt Does patient have an ACCT team?: No Does patient have Intensive In-House Services?  : No Does patient have [facility] services? : No Does patient have P4[facility] services?: No  Level of Care:  OP  Hospital Course:   Ann [name] is an 59 y.o. female that presented to [facility] as a walk-in. Patient was self-referred and brought in by her husband. Patient was tearful, asking for help with alcohol detox. Patient stated she has been drinking for 2 years heavily, and more so in the last year after her husband's sister tried to commit suicide and was in their home for a time last year. Patient stated she has been increasingly more depressed, lost her job, is unable to work, is scared to drive due to anxiety, is not taking care of herself, and stated last night she locked herself in the bathroom and "just laid on the floor." Patient stated she has been hiding the drinking from her husband, but became physically ill, vomiting last night, and she confessed to drinking 8 Bud Platinum beers that have high alcohol content daily, last drank one beer this AM. Patient reported tremor, agitation, nausea, recent vomiting.  Patient reported she has had inpatient treatment once eleven years ago for SA and depression, but has had no psychotropic meds since 2008. Patient tearful on admission expressing that she did not feel safe to go home, is afraid she will die, reports not sleeping, staying in bed, not taking care of self, weight gain and "being out of control." Patient denies SI, but has long hx of depression. Patient denied HI. Patient denied AVH, but states she has been "yelling" at herself a lot. Patient stated she has been yelling at her cats and this scares her.         Ann [name] was admitted to the adult 300 unit under the care of Dr. Dub Mikes where she was evaluated and her symptoms were identified. Medication management was discussed and implemented. Patient completed the librium detox protocol for the purposes of alcohol detox with no reported complications. She was documented to be taking any psychiatric medications prior to admission. Patient was started on Campral 666 mg TID for alcohol dependence. Patient was also started on Zoloft 25 mg daily for depressive symptoms.  She was encouraged to participate in unit programming. Medical problems were identified and treated appropriately. Home medication was  restarted as needed.  She was evaluated each day by a clinical provider to ascertain the patient's response to treatment.  Improvement was noted by the patient's report of decreasing symptoms, improved sleep and appetite, affect, medication tolerance, behavior, and participation in unit programming.  The patient was asked each day to complete a self inventory noting mood, mental status, pain, new symptoms, anxiety and concerns.         She responded well to medication and being in a therapeutic and supportive environment. The patient reported feeling too sleepy during the day after Remeron was started. The patient reported improved sleep after it was changed to Vistaril 50 mg hs prn insomnia. Positive and appropriate  behavior was noted and the patient was motivated for recovery.  She worked closely with the treatment team and case manager to develop a discharge plan with appropriate goals. Coping skills, problem solving as well as relaxation therapies were also part of the unit programming. Her Zoloft was increased to 50 mg daily prior to discharge as patient had reported prior positive effect at that dosage.          By the day of discharge she was in much improved condition than upon admission.  Symptoms were reported as significantly decreased or resolved completely. The patient denied SI/HI and voiced no AVH. She was motivated to continue taking medication with a goal of continued improvement in mental health.  Ann [name] was discharged home with a plan to follow up as noted below. The patient was provided with three day sample medications and prescriptions at time of discharge. She left [facility] in stable condition with all belongings returned to her.    Consults:  None  Significant Diagnostic Studies:  Chemistry panel, CBC, UDS negative, alcohol level 72  Discharge Vitals:   Blood pressure 126/80, pulse 92, temperature 98.3 F (36.8 C), temperature source Oral, resp. rate 18, height 5' 1.42" (1.56 m), weight 66.679 kg (147 lb). Body mass index is 27.4 kg/(m^2). Lab Results:   No results found for this or any previous visit (from the past 72 hour(s)).  Physical Findings: AIMS: Facial and Oral Movements Muscles of Facial Expression: None, normal Lips and Perioral Area: None, normal Jaw: None, normal Tongue: None, normal,Extremity Movements Upper (arms, wrists, hands, fingers): None, normal Lower (legs, knees, ankles, toes): None, normal, Trunk Movements Neck, shoulders, hips: None, normal, Overall Severity Severity of abnormal movements (highest score from questions above): None, normal Incapacitation due to abnormal movements: None, normal Patient's awareness of abnormal movements (rate only patient's  report): No Awareness, Dental Status Current problems with teeth and/or dentures?: No Does patient usually wear dentures?: No  CIWA:  CIWA-Ar Total: 0 COWS:      See Psychiatric Specialty Exam and Suicide Risk Assessment completed by Attending Physician prior to discharge.  Discharge destination:  Home  Is patient on multiple antipsychotic therapies at discharge:  No   Has Patient had three or more failed trials of antipsychotic monotherapy by history:  No  Recommended Plan for Multiple Antipsychotic Therapies: NA     Medication List    STOP taking these medications        Calcium Carbonate-Vitamin D 600-400 MG-UNIT per tablet     STOOL SOFTENER PO      TAKE these medications      Indication   acamprosate 333 MG tablet  Commonly known as:  CAMPRAL  Take 2 tablets (666 mg total) by mouth 3 (three) times daily with meals.   Indication:  Excessive Use of Alcohol     acetaminophen 650 MG CR tablet  Commonly known as:  TYLENOL  Take 1 tablet (650 mg total) by mouth every 8 (eight) hours as needed for pain.      denosumab 60 MG/ML Soln injection  Commonly known as:  PROLIA  Inject 60 mg into the skin every 6 (six) months. Administer in upper arm, thigh, or abdomen   Indication:  Osteoporosis     hydrOXYzine 50 MG tablet  Commonly known as:  ATARAX/VISTARIL  Take 1 tablet (50 mg total) by mouth at bedtime as needed (sleep).   Indication:  Sedation     meloxicam 7.5 MG tablet  Commonly known as:  MOBIC  Take 1 tablet (7.5 mg total) by mouth daily.   Indication:  Joint Damage causing Pain and Loss of Function     multivitamin with minerals Tabs tablet  Take 1 tablet by mouth daily.   Indication:  Vitamin Supplementation     niacin 1000 MG CR tablet  Commonly known as:  NIASPAN  Take 1 tablet (1,000 mg total) by mouth at bedtime.   Indication:  High Amount of Fats in the Blood     nicotine 21 mg/24hr patch  Commonly known as:  NICODERM CQ - dosed in mg/24 hours   Place 1 patch (21 mg total) onto the skin daily.   Indication:  Nicotine Addiction     omeprazole 20 MG capsule  Commonly known as:  PRILOSEC  Take 1 capsule (20 mg total) by mouth daily.   Indication:  Gastroesophageal Reflux Disease     sertraline 50 MG tablet  Commonly known as:  ZOLOFT  Take 1 tablet (50 mg total) by mouth daily.   Indication:  Major Depressive Disorder     simvastatin 40 MG tablet  Commonly known as:  ZOCOR  Take 1 tablet (40 mg total) by mouth every evening.   Indication:  Inherited Heterozygous Hypercholesterolemia     thiamine 100 MG tablet  Take 1 tablet (100 mg total) by mouth daily.   Indication:  Deficiency in Thiamine or Vitamin B1       Follow-up Information    Follow up with [facility] On 12/15/2014.   Why:  Appt on 12/15/14 for orientation at 2:30PM with Dewayne Hatch to get set up with [facility]. Please bring completed orientation packet with you to this appt. Thank you!    Contact information:   [address] [place], [place] [address] Phone: [phone] Fax: [phone]      Follow-up recommendations:    Activity: as tolerated Diet: regular Follow up [facility] as above  Comments:   Take all your medications as prescribed by your mental healthcare provider.  Report any adverse effects and or reactions from your medicines to your outpatient provider promptly.  Patient is instructed and cautioned to not engage in alcohol and or illegal drug use while on prescription medicines.  In the event of worsening symptoms, patient is instructed to call the crisis hotline, 911 and or go to the nearest ED for appropriate evaluation and treatment of symptoms.  Follow-up with your primary care provider for your other medical issues, concerns and or health care needs.   Total Discharge Time: Greater than 30 minutes  Signed: Fransisca Kaufmann 12/14/2014, 6:59 PM, NP-C I personally assessed the patient and formulated  the plan Madie Reno A. Dub Mikes, M.D.

## 2014-12-14 NOTE — Progress Notes (Signed)
Discharge note: Patient discharged home per MD order.  Patient received all personal belongings, prescriptions and medication samples.  Patient denies SI/HI/AVH.  Reviewed follow up instructions and medication indications.  Patient indicated understanding.  Patient left ambulatory with her husband.

## 2014-12-15 ENCOUNTER — Encounter (HOSPITAL_COMMUNITY): Payer: Self-pay | Admitting: Psychology

## 2014-12-16 ENCOUNTER — Other Ambulatory Visit (HOSPITAL_COMMUNITY): Admitting: Psychology

## 2014-12-16 DIAGNOSIS — M810 Age-related osteoporosis without current pathological fracture: Secondary | ICD-10-CM | POA: Insufficient documentation

## 2014-12-16 DIAGNOSIS — F419 Anxiety disorder, unspecified: Secondary | ICD-10-CM | POA: Insufficient documentation

## 2014-12-16 DIAGNOSIS — E780 Pure hypercholesterolemia: Secondary | ICD-10-CM | POA: Diagnosis not present

## 2014-12-16 DIAGNOSIS — Z91411 Personal history of adult psychological abuse: Secondary | ICD-10-CM | POA: Insufficient documentation

## 2014-12-16 DIAGNOSIS — F4312 Post-traumatic stress disorder, chronic: Secondary | ICD-10-CM | POA: Insufficient documentation

## 2014-12-16 DIAGNOSIS — F332 Major depressive disorder, recurrent severe without psychotic features: Secondary | ICD-10-CM | POA: Diagnosis not present

## 2014-12-16 DIAGNOSIS — F10280 Alcohol dependence with alcohol-induced anxiety disorder: Secondary | ICD-10-CM | POA: Insufficient documentation

## 2014-12-17 ENCOUNTER — Other Ambulatory Visit (HOSPITAL_COMMUNITY): Attending: Psychiatry | Admitting: Licensed Clinical Social Worker

## 2014-12-17 ENCOUNTER — Encounter (HOSPITAL_COMMUNITY): Payer: Self-pay | Admitting: Psychology

## 2014-12-17 DIAGNOSIS — F1020 Alcohol dependence, uncomplicated: Secondary | ICD-10-CM

## 2014-12-17 DIAGNOSIS — F10280 Alcohol dependence with alcohol-induced anxiety disorder: Secondary | ICD-10-CM | POA: Diagnosis not present

## 2014-12-18 ENCOUNTER — Other Ambulatory Visit (HOSPITAL_COMMUNITY)

## 2014-12-18 ENCOUNTER — Encounter (HOSPITAL_COMMUNITY): Payer: Self-pay | Admitting: Psychology

## 2014-12-18 ENCOUNTER — Encounter (HOSPITAL_COMMUNITY): Payer: Self-pay | Admitting: Licensed Clinical Social Worker

## 2014-12-18 NOTE — Progress Notes (Signed)
Ann [name] is a 59 y.o. female patient. Orientation to CD-IOP:  The patient is a 59 yo married, Caucasian, female seeking entry into the CD-IOP. She was referred by Dr. Dub Mikes after successfully completing an alcohol detox upstairs in [facility]. She lives with her husband in [place], [place]. The patient reported a long history of alcohol use that began in her teens. She reported her drinking was managed for years, but approximately 10 years ago she completed a 7 day detox in [place], [place] where she was living at the time. In 2005, after 26 years of marriage, she and her husband divorced. The patient reported she had been 'emotionally distraught' over the divorce and her drinking increased significantly. The patient reported that after discharge from detox in 2006, she managed to remain sober for 2 years. The patient reported she had been prescribed Campral and had done very well on this medication. But, in 2008, she lost her job and with it her insurance. She could no longer afford the prescribed medications, stopped taking them and began drinking again. The patient described her first husband as a "functioning alcoholic. Those years had been filled with emotional distance and verbal abuse. "It was like walking on egg shells", she stated. The patient struggled in those years after the divorce and her 3 sons each had their own share of problems with addiction. In 2012, the patient married her second husband, Ann [name]. He is 10 years older than the patient, but he treats her well, is a minimal social drinker and he currently works part-time as a Production designer, theatre/television/film man at a retirement community. She admitted she had hidden the amount of beer she was drinking from both her husband and adult children (10 Bud Light Platinum per day), but she recently acknowledged a problem and sought treatment. The patient has an extensive history of addiction in her family history with a paternal grandfather, paternal uncle, mother and maternal  grandfather all alcoholic. The patient's own child hood was very rigid with a military father who was extremely demanding. She was the older of 2 children with a brother 2 years younger than she. The patient's mother became alcoholic after her husband left her after 26 years of marriage. She developed liver disease and stopped drinking entirely in 2002.  In recounting some of her history, the patient seems to minimize the chaos and destruction of her addiction. She also minimizes the addiction among her children and their continued use of drugs. The patient struggles with health issues, including osteoporosis, high cholesterol and long history of smoking. She has significant pain in her right hip and will eventually be referred for a hip replacement. Her PCP is Dr. Rodney Langton, MD at [facility] in [place], [place]. The patient was very pleasant and the orientation was completed accordingly. She will return tomorrow and enter the CD-IOP.         Willow Reczek, LCAS

## 2014-12-18 NOTE — Progress Notes (Signed)
    Daily Group Progress Note  Program: CD-IOP   Group Time: 1-2:30  Participation Level: Active  Behavioral Response: Appropriate and Sharing  Type of Therapy: Process Group  Topic: After checking in with sobriety dates, group members took turns sharing about recovery-related activities and challenges they faced since the last group. They also shared any urges or cravings they may have experienced.  Group members were openly engaged in suggestions and feedback.   Group Time: 2:45-4  Participation Level: Active  Behavioral Response: Appropriate and Sharing  Type of Therapy: Psycho-education Group  Topic: The second part of group focused on resentments and forgiveness. A handout was provided to group members explaining the process of letting go of resentments and moving towards forgiveness. Group members were able to share their personal resentments and then were able to be open to work the steps of releasing the resentment and letting go of the hurt.      Summary: Pt reports she feels that her head is full of cobwebs. She has not been to any meetings but has spoken to other women in the group about going to the same meeting together. She is having real conversations with her husband and not under the influence of alcohol or isolating to not spend any time with him. Pt reports she has resentment with herself. "When I was drinking I was selfish." She received good feedback from other group members on the same feelings. Pt is new in the program. Her sobriety date is 8/20.      Family Program: Family present? No   Name of family member(s):   UDS collected: No Results:   AA/NA attended?: No  Sponsor?: No   MACKENZIE,LISBETH S, Licensed Cli

## 2014-12-18 NOTE — Progress Notes (Signed)
Ann [name] is a 59 y.o. female patient. CD-IOP: Treatment Planning Session. I met with the patient this afternoon at the conclusion of her group session. She reported she had found the session very powerful and admitted that she realizes she has resentment towards her first husband. "I realized that I wouldn't care if he died", she explained. I suggested that it would seem normal to be resentful towards him based on the way he treated her and her children for 26 years of marriage, but that eventually, she would want to address this and let it go. She reported that one day she had been reading something about psychology and she came upon the definition of narcissism. She recognized her husband in this description. The patient agreed that she would address this resentment while here in the program. I explained the importance of identifying goals for treatment and the patient was very agreeable to this. She identified her first goal as continued sobriety. If she doesn't stay sober she won't be available for her husband, sons or even her grandson and she really wants to be there for them. She concurred that she needed help if she was to remain sober and would be seeking that support within the [facility]. The patient admitted that she didn't really have any friends and would welcome getting to know other women in recovery. The patient stated that these two goals would keep her busy. I explained that she will meet with the medical director next Monday during group and he can address any medical issues or concerns if she has them.  The treatment plan was reviewed, signed and completed accordingly. As we walked to the door, I reminded the patient to check the internet and attend at least one AA meeting in [place] between now and Monday. She stated that she would find one and share about it on Monday. We will continue to follow this patient closely in the days ahead. Her sobriety date is 8/20.          Shamiyah Ngu, LCAS

## 2014-12-18 NOTE — Progress Notes (Signed)
    Daily Group Progress Note  Program: CD-IOP   Group Time: 1-2:30 pm  Participation Level: Active  Behavioral Response: Appropriate and Sharing  Type of Therapy: Process Group  Topic: Process: the first part of group was spent in process. After checking in, members shared about any issues or concerns they are dealing with in early recovery. A new group member was present and she was asked to introduce herself briefly. She shared about her alcohol use over the years and the problems that had escalated, as a result of her drinking, over the past couple of years. She admitted she was anxious about being here, but stated that she felt very welcomed and she felt much better as the session had progressed.  Group Time: 2:45- 4pm  Participation Level: Minimal  Behavioral Response: Sharing  Type of Therapy: Psycho-education Group  Topic: "Resentments, Part II"/Graduation: the second half of group included a continued discussion on Resentments. Members had been provided a handout on Monday and a psycho-ed had been provided educating members on the dangers that resentments represent in early recovery. Today, one member shared at length about the resentment he had towards an aunt and why he had to let go of it. At the end of the session a graduation ceremony was held for a member successfully completing today. His wife and 59 yo daughter arrived to be present for this ceremony. There were tears from family as group members said their 'good-byes'. The patient's family also shared about their pride and love for their family member.   Summary: The patient was new to the program. She introduced herself and shared a little about her relationship with alcohol. She had gone upstairs and been in detox for 3 days prior to getting here. The patient reported she had gone to treatment about 10 years ago and managed to remain sober for 2 years. She admitted she had stopped going to meetings and eventually the  alcohol slipped back in. When another patient mentioned something about Campral, this patient reported she had been prescribed that after she got out of treatment. She had found it very helpful for her, but when she lost her job, she lost her insurance and could not longer afford it. 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 The patient reported that her husband is not a big drinker, but may have a glass of wine every once in awhile. "But he sips it", the patient laughed and noted that she could down a beer in less than 2 minutes while her husband may not finish the wine in 30 minutes. The patient was attentive, but quiet during the psycho-ed. She had already shared quite a bit in process, but watched and listened as others shared about resentments. The patient got a little teary as the family members of the graduating member spoke of their love for him. She did well considering this was her first group session and she told the group she would come back. The patient responded well to this intervention and her sobriety date is 8/20.   Family Program: Family present? No   Name of family member(s):   UDS collected: No Results:   AA/NA attended?: No, but she is new to the program.  Sponsor?: No   Christoher Drudge, LCAS

## 2014-12-21 ENCOUNTER — Encounter (HOSPITAL_COMMUNITY): Payer: Self-pay | Admitting: Medical

## 2014-12-21 ENCOUNTER — Other Ambulatory Visit (HOSPITAL_COMMUNITY): Payer: Self-pay | Admitting: Medical

## 2014-12-21 ENCOUNTER — Other Ambulatory Visit (HOSPITAL_COMMUNITY): Admitting: Psychology

## 2014-12-21 DIAGNOSIS — R740 Nonspecific elevation of levels of transaminase and lactic acid dehydrogenase [LDH]: Secondary | ICD-10-CM

## 2014-12-21 DIAGNOSIS — R7401 Elevation of levels of liver transaminase levels: Secondary | ICD-10-CM | POA: Insufficient documentation

## 2014-12-21 DIAGNOSIS — F10180 Alcohol abuse with alcohol-induced anxiety disorder: Secondary | ICD-10-CM

## 2014-12-21 DIAGNOSIS — F10129 Alcohol abuse with intoxication, unspecified: Secondary | ICD-10-CM

## 2014-12-21 DIAGNOSIS — F1020 Alcohol dependence, uncomplicated: Secondary | ICD-10-CM

## 2014-12-21 DIAGNOSIS — F19982 Other psychoactive substance use, unspecified with psychoactive substance-induced sleep disorder: Secondary | ICD-10-CM

## 2014-12-21 DIAGNOSIS — T7431XA Adult psychological abuse, confirmed, initial encounter: Secondary | ICD-10-CM | POA: Insufficient documentation

## 2014-12-21 DIAGNOSIS — T7431XS Adult psychological abuse, confirmed, sequela: Secondary | ICD-10-CM

## 2014-12-21 DIAGNOSIS — F10280 Alcohol dependence with alcohol-induced anxiety disorder: Secondary | ICD-10-CM | POA: Diagnosis not present

## 2014-12-21 DIAGNOSIS — F4312 Post-traumatic stress disorder, chronic: Secondary | ICD-10-CM | POA: Insufficient documentation
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
## 2014-12-22 ENCOUNTER — Other Ambulatory Visit (HOSPITAL_COMMUNITY)

## 2014-12-23 ENCOUNTER — Encounter (HOSPITAL_COMMUNITY): Payer: Self-pay | Admitting: Psychology

## 2014-12-23 ENCOUNTER — Other Ambulatory Visit (HOSPITAL_COMMUNITY): Admitting: Psychology

## 2014-12-23 DIAGNOSIS — F10280 Alcohol dependence with alcohol-induced anxiety disorder: Secondary | ICD-10-CM | POA: Diagnosis not present

## 2014-12-23 DIAGNOSIS — T7431XS Adult psychological abuse, confirmed, sequela: Secondary | ICD-10-CM

## 2014-12-23 DIAGNOSIS — F4312 Post-traumatic stress disorder, chronic: Secondary | ICD-10-CM

## 2014-12-23 DIAGNOSIS — F1020 Alcohol dependence, uncomplicated: Secondary | ICD-10-CM

## 2014-12-23 NOTE — Progress Notes (Signed)
    Daily Group Progress Note  Program: CD-IOP   Group Time: 1-2:30 pm  Participation Level: Active  Behavioral Response: Sharing  Type of Therapy: Process Group  Topic: Process: The first half of group was spent in process. Members shared about the past weekend and the things they did to support their early recoveries. They were also invited to disclose any challenges or struggles they may have faced to remain alcohol and drug-free. The program director met with the newest group member and also discussed medications and progress with 2 other members. Drug tests were collected from all 6 group members today.   Group Time: 2:45- 4pm  Participation Level: Minimal  Behavioral Response: Appropriate  Type of Therapy: Psycho-education Group  Topic: "Core Beliefs", Part 1: The second half of group was spent in a psycho-ed on "Core Beliefs". A handout was provided explaining Core Beliefs and how they develop. The importance of identifying and challenging negative core beliefs, which many chemically-dependent people hold, was emphasized. Members shared some of their core beliefs and a discussion ensued about how those foundational beliefs, formed in one's early years, continue to play out in our adulthood.   Summary: The patient reported a busy weekend. She had attended her first AA meeting on Thursday evening. There were only 6 women present in the meeting, but they were very attentive to this new member and everyone introduced themselves, told a little of their story and gave her their phone numbers. The patient reported they had been very friendly and the meeting had been very comfortable. She also picked up her 'starter chip'. The patient reported this had been the 'most productive weekend' in months. "I did a lot of stuff", she explained. She went on to note that "I left a lot undone when I was drinking". The patient spent much of the weekend outside working in the yard and cleaning up their  garden. She admitted she had spent 4 hours on Saturday working outside and her body was not happy and told her about it that evening. The patient reminded the group that she has a bad hip and was very sore Saturday evening. I reminded the group that attaining balance is something they must be aware of. I pointed out that this woman has not had a drink in 9 days, but instead of focusing on her ongoing recovery in order to remain alcohol-free, she had spent most of her weekend trying to make up for the lost time and ignoring her recovery. The patient admitted she hadn't thought of that and this was true. The patient missed much of the psycho-ed because she was meeting with the medical director. Upon her return, she reported she was going to a meeting this evening in [place], which is where she lives. The patient did attend 1 meeting over the weekend and she is new to the group and program. However, she will have to increase her focus on recovery-related activities, such as meetings and making friends with women in recovery (she has no friends) if she is to enjoy the fruits of sobriety. The patient's sobriety date is 8/20.   Family Program: Family present? No   Name of family member(s):   UDS collected: Yes Results: pending  AA/NA attended?: YesThursday  Sponsor?: No, but she is new to the program   Naleyah Ohlinger, LCAS

## 2014-12-24 ENCOUNTER — Other Ambulatory Visit (HOSPITAL_COMMUNITY): Attending: Psychiatry | Admitting: Licensed Clinical Social Worker

## 2014-12-24 ENCOUNTER — Encounter (HOSPITAL_COMMUNITY): Payer: Self-pay | Admitting: Licensed Clinical Social Worker

## 2014-12-24 ENCOUNTER — Encounter (HOSPITAL_COMMUNITY): Payer: Self-pay | Admitting: Psychology

## 2014-12-24 DIAGNOSIS — F1020 Alcohol dependence, uncomplicated: Secondary | ICD-10-CM

## 2014-12-24 NOTE — Progress Notes (Signed)
Daily Group Progress Note  Program: CD-IOP   Group Time: 1-2:30 pm  Participation Level: Minimal  Behavioral Response: Sharing  Type of Therapy: Process Group  Topic: Process: the first part of group was spent in process. Members shared about obstacles and challenges in early recovery. Everyone had attended at least one 12-step meeting since we last met. There was good disclosure and feedback among group members. One of the newer members shared that she had taken a pain pill on Sunday evening and was informed by her counselor that this was regarded as a relapse. She was very upset at having to change her sobriety date, but this event generated a good review of the disease concept of addiction.  Group Time: 2:45- 4pm  Participation Level: Minimal  Behavioral Response: Sharing  Type of Therapy: Psycho-education Group  Topic: Psycho-Ed: the second half of group was spent in a psycho-ed. This session followed up the one in the last session addressing Core Beliefs. Today, the way that one's Core Beliefs are developed and whey they are resistant to changing. Members disclosed some of their core beliefs, all of which were negative, and the group worked together on identifying the ways that these are proven incorrect in the member's life. One drug test was collected from the member who was absent on Monday.   Summary: The patient reported she had attended a different AA meeting on Monday evening. It met at 8 pm and she recognized one member from having been in the women's meeting she attended last week. I asked whether she had phoned any of the women who had given her their phone numbers? She reported she had not and questioned whether she was supposed to? I explained it was imperative that she reach out to others in recovery and make some new friends. I reminded her that she had been an isolating solitary drinker and to remain sober she will need to change this. She will have to become more  open and transparent and not hide away from others. She nodded her head and stated she understood this. Later in process, the patient laughed and noted that she can relate to the member who shared he was feeling very critical and cranky about any and everything. She reported that she is being way too conscious of the little things her husband is doing that are getting on her nerves. She admitted she is a "little OCD" and the alcohol probably helped reduce the symptoms of this characteristic. Now that she is not drinking, the compulsive cleaning is becoming more prominent. She talked on about having done little chores around the house and a number of other things. One member asked her how her sobriety was affecting her marriage? The patient reflected on this question for awhile. She reported her husband didn't ask her about her group or the meetings she had gone to, but she would tell him about them. She admitted she didn't really know what he thought. When I wondered out loud to the group how she could find out what he felt about his wife's new and sober life, a member called out, "ask him". The patient reminded the group that her husband had no idea that she was drinking as much as she was. She had managed to hide it from him despite drinking almost a 12-pack every day. In the psycho-ed, the patient shared little of herself. She talks in sessions, but has not done much in the way of revealing her inner self or  becoming more vulnerable. It is likely something she has not done in many years, if ever. We will help her to open up with her feelings.   Family Program: Family present? No   Name of family member(s):   UDS collected: No Results:   AA/NA attended?: [place]  Sponsor?: No, but she is new to the program   Nene Aranas, LCAS

## 2014-12-25 ENCOUNTER — Telehealth (HOSPITAL_COMMUNITY): Payer: Self-pay | Admitting: Licensed Clinical Social Worker

## 2014-12-25 ENCOUNTER — Encounter (HOSPITAL_COMMUNITY): Payer: Self-pay | Admitting: Licensed Clinical Social Worker

## 2014-12-25 ENCOUNTER — Other Ambulatory Visit (HOSPITAL_COMMUNITY)

## 2014-12-25 NOTE — Progress Notes (Signed)
Ann [name] is a 59 y.o. female patient. CD-IOP Individual Counseling Session: Met with pt after group today. After speaking with [facility] they are denying her authorization at [facility]. Apparently [place] does not meet the criteria. Pt was disappointed but understood. I suggested to the pt that she call customer service at [facility] to discuss options.        Ramere Downs S, Licensed Cli

## 2014-12-25 NOTE — Progress Notes (Signed)
    Daily Group Progress Note  Program: CD-IOP   Group Time: 1-2:30  Participation Level: Active  Behavioral Response: Appropriate  Type of Therapy: Process Group  Topic: The first part of group was spent in process where group members shared about obstacles and challenges in early recovery. There was good disclosure and feedback among group members. Today is the beginning of a holiday weekend and group members shared their weekend plans of recovery. A new group member joined the program today. She was very tearful and reported she was feeling overwhelmed. She left the group shortly after beginning to share about herself. She eventually came back to the group but was quiet and tearful the rest of the group.    Group Time: 2:45-4  Participation Level: Active  Behavioral Response: Appropriate  Type of Therapy: Psycho-education Group  Topic: The second half of group focused on self-esteem. The last session which addressed core beliefs, today group members learned how to move forward from their negative core beliefs to working on their self-esteem. All group members shared their self-esteem was very low. Group members participated in a self-esteem guided meditation. Everyone said they enjoyed the meditation as well as the added positive affirmations they were able to repeat to themselves.    Summary: Pt reported she has only gone to 1 meeting this week. She is struggling to find meeting times that don't interfere with her normal routine. Pt was given an AA schedule for [place] where she lives. Pt was challenged to call 1 sober woman daily. She said she thought she could do that. Pt reported while drinking her self-esteem was very low. She had poor negative self-talk which contributed to her low self-esteem. Now that she is in treatment her self-esteem is getting higher. Pt reported she enjoyed the meditation and positive self-talk. Her sobriety date is 8/20.   Family Program: Family  present? No   Name of family member(s):   UDS collected: No Results:   AA/NA attended?: No  Sponsor?: No   Laurabelle Gorczyca S, Licensed Cli

## 2014-12-26 ENCOUNTER — Encounter: Payer: Self-pay | Admitting: Emergency Medicine

## 2014-12-26 ENCOUNTER — Emergency Department (INDEPENDENT_AMBULATORY_CARE_PROVIDER_SITE_OTHER)

## 2014-12-26 ENCOUNTER — Emergency Department (INDEPENDENT_AMBULATORY_CARE_PROVIDER_SITE_OTHER)
Admission: EM | Admit: 2014-12-26 | Discharge: 2014-12-26 | Disposition: A | Discharge status: Home / Self Care | Source: Home / Self Care | Attending: Family Medicine | Admitting: Family Medicine

## 2014-12-26 DIAGNOSIS — M545 Low back pain, unspecified: Secondary | ICD-10-CM

## 2014-12-26 DIAGNOSIS — M5136 Other intervertebral disc degeneration, lumbar region: Secondary | ICD-10-CM | POA: Diagnosis not present

## 2014-12-26 DIAGNOSIS — G8929 Other chronic pain: Secondary | ICD-10-CM

## 2014-12-26 DIAGNOSIS — M47896 Other spondylosis, lumbar region: Secondary | ICD-10-CM | POA: Diagnosis not present

## 2014-12-26 LAB — POCT URINALYSIS DIP (MANUAL ENTRY)
Bilirubin, UA: NEGATIVE
Blood, UA: NEGATIVE
Glucose, UA: NEGATIVE
Ketones, POC UA: NEGATIVE
Leukocytes, UA: NEGATIVE
Nitrite, UA: NEGATIVE
Protein Ur, POC: NEGATIVE
Spec Grav, UA: 1.01 (ref 1.005–1.03)
Urobilinogen, UA: 0.2 (ref 0–1)
pH, UA: 6 (ref 5–8)

## 2014-12-26 LAB — TOXASSURE SELECT 13 (MW), URINE: PDF: 0

## 2014-12-26 MED ORDER — PREDNISONE 20 MG PO TABS: ORAL_TABLET | ORAL | Status: DC

## 2014-12-26 MED ORDER — METHOCARBAMOL 500 MG PO TABS: 500.0000 mg | ORAL_TABLET | Freq: Two times a day (BID) | ORAL | Status: DC

## 2014-12-26 NOTE — Discharge Instructions (Signed)
You may find relief icing down your back for 15-20 minutes at a time and alternating heat as it feels comfortable for you.  You may also look into purchasing a transcutaneous electrical nerve stimulator (TENS unit). You may find some online as low as $20 to over $100.  Whichever unit you may choose, please be sure to read the instructions carefully. You may also wait to speak with Dr. Benjamin Stain, Sports Medicine to discuss other options to help with your lower back pain.

## 2014-12-26 NOTE — ED Notes (Signed)
Pt c/o low back pain starting today, no injury, took Hydrocodone @ 1:30 and Cyclobenzaprine @ 3:00pm no relief.

## 2014-12-26 NOTE — ED Provider Notes (Signed)
CSN: [id number]     Arrival date & time 12/26/14  1636 History   First MD Initiated Contact with Patient 12/26/14 1638     Chief Complaint  Patient presents with  . Back Pain   (Consider location/radiation/quality/duration/timing/severity/associated sxs/prior Treatment) HPI  Pt is a 59yo female with hx of osteoporosis, DDD, and alcoholism presenting to [facility] with c/o lower back pain that started earlier today. Pain is cramping "like labor pains" in bilateral lower back with an occasional "twinge" in her Left lower abdomen. Pt states she took 5-325mg  vicodin at 1:30PM and and cyclobenzaprine at 3PM without relief. Denies known injuries or heavy lifting. Denies urinary symptoms. Pt does report hx of intermittent back pain due to her DDD but denies hx of fractures or back surgeries.  Past Medical History  Diagnosis Date  . Hyperlipemia   . Osteoporosis   . Degenerative disc disease   . Alcoholism    Past Surgical History  Procedure Laterality Date  . Tubal ligation    . Cystocele repair     Family History  Problem Relation Age of Onset  . Cancer Mother     breast  . Alcohol abuse Mother   . Cancer Father     prostate  . Alcohol abuse Paternal Uncle   . Alcohol abuse Maternal Grandfather   . Alcohol abuse Paternal Grandfather    Social History  Substance Use Topics  . Smoking status: Current Every Day Smoker -- 0.50 packs/day for 40 years    Types: Cigarettes  . Smokeless tobacco: Never Used  . Alcohol Use: 42.0 oz/week    70 Cans of beer per week     Comment: 8-9 drinks daily   OB History    No data available     Review of Systems  Constitutional: Negative for fever and chills.  Gastrointestinal: Positive for abdominal pain ( "slight twinge" in LLQ). Negative for nausea, vomiting and diarrhea.  Genitourinary: Negative for dysuria, frequency, hematuria and flank pain.  Musculoskeletal: Positive for myalgias and back pain. Negative for arthralgias and gait problem.  Skin:  Negative for rash.    Allergies  Naproxen; Tramadol; and Desyrel  Home Medications   Prior to Admission medications   Medication Sig Start Date End Date Taking? Authorizing Provider  acamprosate (CAMPRAL) 333 MG tablet Take 2 tablets (666 mg total) by mouth 3 (three) times daily with meals. 12/14/14   Thermon Leyland, NP  acetaminophen (TYLENOL) 650 MG CR tablet Take 1 tablet (650 mg total) by mouth every 8 (eight) hours as needed for pain. 09/15/14   Monica Becton, MD  denosumab (PROLIA) 60 MG/ML SOLN injection Inject 60 mg into the skin every 6 (six) months. Administer in upper arm, thigh, or abdomen 12/14/14   Thermon Leyland, NP  hydrOXYzine (ATARAX/VISTARIL) 50 MG tablet Take 1 tablet (50 mg total) by mouth at bedtime as needed (sleep). 12/14/14   Thermon Leyland, NP  meloxicam (MOBIC) 7.5 MG tablet Take 1 tablet (7.5 mg total) by mouth daily. 12/14/14   Thermon Leyland, NP  methocarbamol (ROBAXIN) 500 MG tablet Take 1 tablet (500 mg total) by mouth 2 (two) times daily. 12/26/14   Junius Finner, PA-C  Multiple Vitamin (MULTIVITAMIN WITH MINERALS) TABS tablet Take 1 tablet by mouth daily. 12/14/14   Thermon Leyland, NP  niacin (NIASPAN) 1000 MG CR tablet Take 1 tablet (1,000 mg total) by mouth at bedtime. 12/14/14   Thermon Leyland, NP  nicotine (NICODERM CQ -  DOSED IN MG/24 HOURS) 21 mg/24hr patch Place 1 patch (21 mg total) onto the skin daily. Patient not taking: Reported on 12/23/2014 12/14/14   Thermon Leyland, NP  omeprazole (PRILOSEC) 20 MG capsule Take 1 capsule (20 mg total) by mouth daily. 12/14/14   Thermon Leyland, NP  predniSONE (DELTASONE) 20 MG tablet 3 tabs po day one, then 2 po daily x 4 days 12/26/14   Junius Finner, PA-C  sertraline (ZOLOFT) 50 MG tablet Take 1 tablet (50 mg total) by mouth daily. 12/14/14   Thermon Leyland, NP  simvastatin (ZOCOR) 40 MG tablet Take 1 tablet (40 mg total) by mouth every evening. 12/14/14   Thermon Leyland, NP  thiamine 100 MG tablet Take 1 tablet (100 mg total) by  mouth daily. 12/14/14   Thermon Leyland, NP   Meds Ordered and Administered this Visit  Medications - No data to display  BP 124/80 mmHg  Pulse 86  Temp(Src) 99 F (37.2 C) (Oral)  Ht  (1.6 m)  Wt 149 lb (67.586 kg)  BMI 26.40 kg/m2  SpO2 97% No data found.   Physical Exam  Constitutional: She appears well-developed and well-nourished. No distress.  HENT:  Head: Normocephalic and atraumatic.  Eyes: Conjunctivae are normal. No scleral icterus.  Neck: Normal range of motion. Neck supple.  No midline bone tenderness, no crepitus or step-offs.   Cardiovascular: Normal rate, regular rhythm and normal heart sounds.   Pulmonary/Chest: Effort normal and breath sounds normal. No respiratory distress. She has no wheezes. She has no rales. She exhibits no tenderness.  Abdominal: Soft. She exhibits no distension and no mass. There is no tenderness. There is no rebound, no guarding and no CVA tenderness.  Musculoskeletal: Normal range of motion. She exhibits tenderness. She exhibits no edema.  Tenderness over SI joint and across lumbar muscles. FROM upper and lower extremities with 5/5 strength bilaterally. Negative straight leg raise  Neurological: She is alert.  Reflex Scores:      Patellar reflexes are 2+ on the right side and 2+ on the left side. Normal gait  Skin: Skin is warm and dry. She is not diaphoretic.  Nursing note and vitals reviewed.   ED Course  Procedures (including critical care time)  Labs Review Labs Reviewed  POCT URINALYSIS DIP (MANUAL ENTRY)    Imaging Review Dg Lumbar Spine Complete  12/26/2014   CLINICAL DATA:  Acute lumbar pain today.  EXAM: LUMBAR SPINE - COMPLETE 4+ VIEW  COMPARISON:  None.  FINDINGS: Five non rib-bearing lumbar type vertebra are identified.  Moderate degenerative disc disease and spondylosis at L3-4 noted.  Mild-moderate facet arthropathy in the lower lumbar spine is identified.  There is no evidence of acute fracture or subluxation. No  focal bony lesions or spondylolysis noted.  IMPRESSION: No evidence of acute abnormality.  Moderate degenerative disc disease and spondylosis at L3-4 and mild to moderate facet arthropathy in the lower lumbar spine.   Electronically Signed   By: Harmon Pier M.D.   On: 12/26/2014 17:46     MDM   1. Acute exacerbation of chronic low back pain     Pt with hx of DDD and osteoporosis c/o Lower back pain that started today w/o known injury. No relief with vicodin or flexeril. UA: no hematuria (doubt kidney stone) or signs of infection Offered pt Toradol, pt declined. Pt does have tenderness over SI joint and lumbar muscles. Plain films ordered to r/o fracture. Plain films: negative  for acute abnormality. Moderate DDD and spondylosis at L3-4 and mild to moderate face arthropathy in lower lumbar spine noted.  Pt has extensive hx of alcohol abuse. Pain is chronic in nature. No red flag symptoms or new injuries. Do not feel comfortable giving pt additional narcotics today. Pt declined Toradol injection in UC.  Pt does have meloxicam at home and is allergic to tramadol. Pt unhappy about not getting additional narcotics.  Encouraged pt to call Dr. Benjamin Stain, Sports Medicine, on Tuesday (Monday is Labor Day) and to schedule a follow up appointment with him or his colleague, Dr. Denyse Amass who is also in Sports Medicine as pt may benefit from an injection or PT.   Also discussed use of a homes TENS unit that could provide immediate relief.      Junius Finner, PA-C 12/26/14 [phone]

## 2014-12-29 ENCOUNTER — Telehealth: Payer: Self-pay | Admitting: Emergency Medicine

## 2014-12-29 ENCOUNTER — Other Ambulatory Visit (HOSPITAL_COMMUNITY)

## 2014-12-30 ENCOUNTER — Ambulatory Visit (INDEPENDENT_AMBULATORY_CARE_PROVIDER_SITE_OTHER): Admitting: Sports Medicine

## 2014-12-30 ENCOUNTER — Encounter: Payer: Self-pay | Admitting: Sports Medicine

## 2014-12-30 ENCOUNTER — Other Ambulatory Visit (HOSPITAL_COMMUNITY)

## 2014-12-30 ENCOUNTER — Ambulatory Visit (INDEPENDENT_AMBULATORY_CARE_PROVIDER_SITE_OTHER)

## 2014-12-30 VITALS — BP 101/63 | HR 98 | Wt 145.0 lb

## 2014-12-30 DIAGNOSIS — M5136 Other intervertebral disc degeneration, lumbar region: Secondary | ICD-10-CM

## 2014-12-30 DIAGNOSIS — F10980 Alcohol use, unspecified with alcohol-induced anxiety disorder: Secondary | ICD-10-CM | POA: Diagnosis not present

## 2014-12-30 DIAGNOSIS — J42 Unspecified chronic bronchitis: Secondary | ICD-10-CM | POA: Insufficient documentation

## 2014-12-30 DIAGNOSIS — F10180 Alcohol abuse with alcohol-induced anxiety disorder: Secondary | ICD-10-CM

## 2014-12-30 DIAGNOSIS — J410 Simple chronic bronchitis: Secondary | ICD-10-CM | POA: Diagnosis not present

## 2014-12-30 DIAGNOSIS — M51369 Other intervertebral disc degeneration, lumbar region without mention of lumbar back pain or lower extremity pain: Secondary | ICD-10-CM

## 2014-12-30 DIAGNOSIS — F10129 Alcohol abuse with intoxication, unspecified: Secondary | ICD-10-CM

## 2014-12-30 MED ORDER — DOXYCYCLINE HYCLATE 100 MG PO TABS: 100.0000 mg | ORAL_TABLET | Freq: Two times a day (BID) | ORAL | Status: AC

## 2014-12-30 MED ORDER — SERTRALINE HCL 50 MG PO TABS: 50.0000 mg | ORAL_TABLET | Freq: Every day | ORAL | Status: DC

## 2014-12-30 MED ORDER — HYDROXYZINE HCL 50 MG PO TABS: 50.0000 mg | ORAL_TABLET | Freq: Every evening | ORAL | Status: DC | PRN

## 2014-12-30 MED ORDER — ACAMPROSATE CALCIUM 333 MG PO TBEC: 666.0000 mg | DELAYED_RELEASE_TABLET | Freq: Three times a day (TID) | ORAL | Status: DC

## 2014-12-30 NOTE — Assessment & Plan Note (Signed)
Axial, nonradicular pain that has since resolved. Home rehabilitation exercises given.  Return as needed for this.

## 2014-12-30 NOTE — Assessment & Plan Note (Signed)
Patient already has 3 days of prednisone, adding a CXR and doxycycline.

## 2014-12-30 NOTE — Progress Notes (Signed)
  Subjective:    CC: Follow-up  HPI: Low back pain: Seen in the urgent care, and referred to me for further evaluation and treatment, overall pain has actually resolved. She did have some lumbar degenerative changes on x-ray.  Alcohol abuse: Self admitted, she was in the hospital, and is now currently in intensive outpatient redilatation. She was started on Acamprosate, Zoloft, hydroxyzine, thiamine. Overall doing well, needs refills on medications.  Chronic bronchitis: Still smoking, now with a recurrence of cough, productive, no constitutional symptoms,  Past medical history, Surgical history, Family history not pertinant except as noted below, Social history, Allergies, and medications have been entered into the medical record, reviewed, and no changes needed.   Review of Systems: No fevers, chills, night sweats, weight loss, chest pain, or shortness of breath.   Objective:    General: Well Developed, well nourished, and in no acute distress.  Neuro: Alert and oriented x3, extra-ocular muscles intact, sensation grossly intact.  HEENT: Normocephalic, atraumatic, pupils equal round reactive to light, neck supple, no masses, no lymphadenopathy, thyroid nonpalpable.  Skin: Warm and dry, no rashes. Cardiac: Regular rate and rhythm, no murmurs rubs or gallops, no lower extremity edema.  Respiratory: Clear to auscultation bilaterally. Not using accessory muscles, speaking in full sentences.  Impression and Recommendations:    I spent 25 minutes with this patient, greater than 50% was face-to-face time counseling regarding the above diagnoses

## 2014-12-30 NOTE — Assessment & Plan Note (Signed)
Continue Zoloft. Acamprosate. And continue intensive outpatient follow-up, she does need to check in the [place] as to whether they offer the services.

## 2014-12-31 ENCOUNTER — Other Ambulatory Visit (HOSPITAL_COMMUNITY)

## 2015-01-01 ENCOUNTER — Other Ambulatory Visit (HOSPITAL_COMMUNITY)

## 2015-01-04 ENCOUNTER — Other Ambulatory Visit (HOSPITAL_COMMUNITY)

## 2015-01-05 ENCOUNTER — Other Ambulatory Visit (HOSPITAL_COMMUNITY)

## 2015-01-06 ENCOUNTER — Other Ambulatory Visit (HOSPITAL_COMMUNITY)

## 2015-01-07 ENCOUNTER — Other Ambulatory Visit (HOSPITAL_COMMUNITY)

## 2015-01-08 ENCOUNTER — Ambulatory Visit (INDEPENDENT_AMBULATORY_CARE_PROVIDER_SITE_OTHER): Admitting: Sports Medicine

## 2015-01-08 ENCOUNTER — Other Ambulatory Visit (HOSPITAL_COMMUNITY)

## 2015-01-08 ENCOUNTER — Encounter: Payer: Self-pay | Admitting: Sports Medicine

## 2015-01-08 VITALS — BP 139/79 | HR 82 | Ht 63.0 in | Wt 146.0 lb

## 2015-01-08 DIAGNOSIS — M810 Age-related osteoporosis without current pathological fracture: Secondary | ICD-10-CM | POA: Diagnosis not present

## 2015-01-08 DIAGNOSIS — M5136 Other intervertebral disc degeneration, lumbar region: Secondary | ICD-10-CM

## 2015-01-08 DIAGNOSIS — M51369 Other intervertebral disc degeneration, lumbar region without mention of lumbar back pain or lower extremity pain: Secondary | ICD-10-CM

## 2015-01-08 NOTE — Progress Notes (Signed)

## 2015-01-08 NOTE — Addendum Note (Signed)
Addended by: Monica Becton on: 01/08/2015 10:55 AM   Modules accepted: Orders

## 2015-01-08 NOTE — Assessment & Plan Note (Signed)
Has been on Prolia injections for sometime now, it has been 2 years since her most recent bone density test. At this point we will recheck the test to ensure improvement in bone density.

## 2015-01-08 NOTE — Assessment & Plan Note (Signed)
Custom orthotics as above. 

## 2015-01-11 ENCOUNTER — Other Ambulatory Visit (HOSPITAL_COMMUNITY)

## 2015-01-12 ENCOUNTER — Other Ambulatory Visit (HOSPITAL_COMMUNITY)

## 2015-01-13 ENCOUNTER — Other Ambulatory Visit (HOSPITAL_COMMUNITY)

## 2015-01-14 ENCOUNTER — Other Ambulatory Visit (HOSPITAL_COMMUNITY)

## 2015-01-15 ENCOUNTER — Other Ambulatory Visit (HOSPITAL_COMMUNITY)

## 2015-01-15 ENCOUNTER — Other Ambulatory Visit: Payer: Self-pay | Admitting: Sports Medicine

## 2015-01-15 DIAGNOSIS — Z1231 Encounter for screening mammogram for malignant neoplasm of breast: Secondary | ICD-10-CM

## 2015-01-18 ENCOUNTER — Other Ambulatory Visit (HOSPITAL_COMMUNITY)

## 2015-01-19 ENCOUNTER — Other Ambulatory Visit (HOSPITAL_COMMUNITY)

## 2015-01-20 ENCOUNTER — Other Ambulatory Visit (HOSPITAL_COMMUNITY)

## 2015-01-21 ENCOUNTER — Other Ambulatory Visit (HOSPITAL_COMMUNITY)

## 2015-01-22 ENCOUNTER — Ambulatory Visit

## 2015-01-22 ENCOUNTER — Other Ambulatory Visit: Payer: Self-pay | Admitting: Sports Medicine

## 2015-01-22 ENCOUNTER — Ambulatory Visit (HOSPITAL_BASED_OUTPATIENT_CLINIC_OR_DEPARTMENT_OTHER)
Admission: RE | Admit: 2015-01-22 | Discharge: 2015-01-22 | Disposition: A | Discharge status: Home / Self Care | Source: Ambulatory Visit | Attending: Sports Medicine | Admitting: Sports Medicine

## 2015-01-22 ENCOUNTER — Ambulatory Visit (HOSPITAL_BASED_OUTPATIENT_CLINIC_OR_DEPARTMENT_OTHER): Admission: RE | Admit: 2015-01-22 | Source: Ambulatory Visit

## 2015-01-22 ENCOUNTER — Other Ambulatory Visit (HOSPITAL_COMMUNITY)

## 2015-01-22 DIAGNOSIS — Z1231 Encounter for screening mammogram for malignant neoplasm of breast: Secondary | ICD-10-CM

## 2015-01-22 DIAGNOSIS — Z780 Asymptomatic menopausal state: Secondary | ICD-10-CM | POA: Insufficient documentation

## 2015-01-22 DIAGNOSIS — M810 Age-related osteoporosis without current pathological fracture: Secondary | ICD-10-CM

## 2015-01-22 DIAGNOSIS — M8580 Other specified disorders of bone density and structure, unspecified site: Secondary | ICD-10-CM | POA: Insufficient documentation

## 2015-01-25 ENCOUNTER — Other Ambulatory Visit (HOSPITAL_COMMUNITY)

## 2015-01-25 ENCOUNTER — Encounter: Payer: Self-pay | Admitting: Medical

## 2015-01-26 ENCOUNTER — Other Ambulatory Visit (HOSPITAL_COMMUNITY)

## 2015-01-27 ENCOUNTER — Other Ambulatory Visit (HOSPITAL_COMMUNITY)

## 2015-01-28 ENCOUNTER — Other Ambulatory Visit: Payer: Self-pay

## 2015-01-28 ENCOUNTER — Other Ambulatory Visit (HOSPITAL_COMMUNITY)

## 2015-01-29 ENCOUNTER — Other Ambulatory Visit (HOSPITAL_COMMUNITY)

## 2015-02-01 ENCOUNTER — Other Ambulatory Visit (HOSPITAL_COMMUNITY)

## 2015-02-02 ENCOUNTER — Other Ambulatory Visit (HOSPITAL_COMMUNITY)

## 2015-02-03 ENCOUNTER — Other Ambulatory Visit (HOSPITAL_COMMUNITY)

## 2015-02-04 ENCOUNTER — Other Ambulatory Visit (HOSPITAL_COMMUNITY)

## 2015-02-05 ENCOUNTER — Other Ambulatory Visit (HOSPITAL_COMMUNITY)

## 2015-02-08 ENCOUNTER — Other Ambulatory Visit (HOSPITAL_COMMUNITY)

## 2015-02-09 ENCOUNTER — Other Ambulatory Visit (HOSPITAL_COMMUNITY)

## 2015-02-10 ENCOUNTER — Other Ambulatory Visit (HOSPITAL_COMMUNITY)

## 2015-02-11 ENCOUNTER — Other Ambulatory Visit: Payer: Self-pay

## 2015-04-05 ENCOUNTER — Ambulatory Visit: Payer: Self-pay | Admitting: Sports Medicine

## 2015-06-22 ENCOUNTER — Telehealth: Payer: Self-pay

## 2015-06-22 DIAGNOSIS — M810 Age-related osteoporosis without current pathological fracture: Secondary | ICD-10-CM

## 2015-06-22 MED ORDER — VARENICLINE TARTRATE 0.5 MG X 11 & 1 MG X 42 PO MISC: ORAL | Status: DC

## 2015-06-22 MED ORDER — DENOSUMAB 60 MG/ML ~~LOC~~ SOLN: 60.0000 mg | SUBCUTANEOUS | Status: DC

## 2015-06-22 NOTE — Telephone Encounter (Signed)
Pt states she is [employer] and appointments with you aren't covered. She is in need of a hip replacement but doctor will not do it until patient quits smoking. Would like to know if you will start her on Chantix? She also would like to know if you will order Prolia so that she can administer it to her self at home. Please advise.

## 2015-06-22 NOTE — Telephone Encounter (Signed)
Rx sent for prolia, chantix rx sent.

## 2015-06-23 NOTE — Telephone Encounter (Signed)
Notified patient that Chantix and Prolia were sent to the pharmacy.

## 2015-06-25 ENCOUNTER — Other Ambulatory Visit: Payer: Self-pay | Admitting: Sports Medicine

## 2015-06-25 ENCOUNTER — Telehealth: Payer: Self-pay | Admitting: *Deleted

## 2015-06-25 DIAGNOSIS — E785 Hyperlipidemia, unspecified: Secondary | ICD-10-CM

## 2015-06-25 DIAGNOSIS — M810 Age-related osteoporosis without current pathological fracture: Secondary | ICD-10-CM

## 2015-06-25 MED ORDER — SERTRALINE HCL 50 MG PO TABS: 50.0000 mg | ORAL_TABLET | Freq: Every day | ORAL | Status: DC

## 2015-06-25 MED ORDER — SIMVASTATIN 40 MG PO TABS: 40.0000 mg | ORAL_TABLET | Freq: Every evening | ORAL | Status: DC

## 2015-06-25 MED ORDER — HYDROXYZINE HCL 50 MG PO TABS: 50.0000 mg | ORAL_TABLET | Freq: Every evening | ORAL | Status: DC | PRN

## 2015-06-25 MED ORDER — ACAMPROSATE CALCIUM 333 MG PO TBEC: 666.0000 mg | DELAYED_RELEASE_TABLET | Freq: Three times a day (TID) | ORAL | Status: DC

## 2015-06-25 MED ORDER — NIACIN ER (ANTIHYPERLIPIDEMIC) 1000 MG PO TBCR: 1000.0000 mg | EXTENDED_RELEASE_TABLET | Freq: Every day | ORAL | Status: DC

## 2015-06-25 MED ORDER — MELOXICAM 7.5 MG PO TABS: 7.5000 mg | ORAL_TABLET | Freq: Every day | ORAL | Status: DC

## 2015-06-25 NOTE — Telephone Encounter (Signed)
Plan initially states medication is not reimbursable by insurance at this time.Inititiated PA for prolia and response back was that prolia was not a covered by plan.

## 2015-06-25 NOTE — Telephone Encounter (Signed)
Initiated a PA on chantix for patient and response back was no further PA needed; PA not applicable for the medication

## 2015-06-28 NOTE — Telephone Encounter (Signed)
So chantix is covered, prolia is not? We can switch to fosamax for osteoporosis if she would like.

## 2015-06-29 NOTE — Telephone Encounter (Signed)
  Left message on patient's vm to call and let us know if ok to send fosamax

## 2015-06-30 ENCOUNTER — Other Ambulatory Visit: Payer: Self-pay

## 2015-06-30 MED ORDER — VARENICLINE TARTRATE 0.5 MG X 11 & 1 MG X 42 PO MISC: ORAL | Status: DC

## 2015-06-30 NOTE — Telephone Encounter (Signed)
Patient would like to go ahead and try the fosamax. She would like for  [facility].   Also patient states he insurance will cover the chantix as long as it's sent [facility] for 90 day supply  The patient has [employer] fax# [phone] with fax cover sheet. ID # needs to be included  If you print the rx out then Ill come and fax them

## 2015-07-01 MED ORDER — ALENDRONATE SODIUM 70 MG PO TABS: 70.0000 mg | ORAL_TABLET | ORAL | Status: DC

## 2015-07-01 MED ORDER — VARENICLINE TARTRATE 0.5 MG X 11 & 1 MG X 42 PO MISC: ORAL | Status: DC

## 2015-07-01 NOTE — Telephone Encounter (Signed)
Rx for both printed

## 2015-07-01 NOTE — Telephone Encounter (Signed)
rxs faxed

## 2015-07-10 ENCOUNTER — Other Ambulatory Visit: Payer: Self-pay | Admitting: Sports Medicine

## 2015-07-12 MED ORDER — VARENICLINE TARTRATE 1 MG PO TABS: 1.0000 mg | ORAL_TABLET | Freq: Two times a day (BID) | ORAL | Status: DC

## 2015-07-23 ENCOUNTER — Other Ambulatory Visit: Payer: Self-pay

## 2015-07-23 MED ORDER — VARENICLINE TARTRATE 1 MG PO TABS: 1.0000 mg | ORAL_TABLET | Freq: Two times a day (BID) | ORAL | Status: DC

## 2015-07-27 ENCOUNTER — Telehealth: Payer: Self-pay

## 2015-07-27 ENCOUNTER — Ambulatory Visit (INDEPENDENT_AMBULATORY_CARE_PROVIDER_SITE_OTHER): Admitting: Sports Medicine

## 2015-07-27 VITALS — BP 118/67 | HR 91

## 2015-07-27 DIAGNOSIS — E876 Hypokalemia: Secondary | ICD-10-CM | POA: Insufficient documentation

## 2015-07-27 LAB — BASIC METABOLIC PANEL WITH GFR
Calcium: 9.6 mg/dL (ref 8.6–10.4)
Creat: 0.82 mg/dL (ref 0.50–1.05)
Sodium: 138 mmol/L (ref 135–146)

## 2015-07-27 LAB — BASIC METABOLIC PANEL
BUN: 9 mg/dL (ref 7–25)
CO2: 23 mmol/L (ref 20–31)
Chloride: 103 mmol/L (ref 98–110)
Glucose, Bld: 115 mg/dL — ABNORMAL HIGH (ref 65–99)
Potassium: 4.9 mmol/L (ref 3.5–5.3)

## 2015-07-27 MED ORDER — POTASSIUM CHLORIDE CRYS ER 20 MEQ PO TBCR: 40.0000 meq | EXTENDED_RELEASE_TABLET | Freq: Every day | ORAL | Status: DC

## 2015-07-27 NOTE — Telephone Encounter (Signed)
Coming in for a nurse visit at 2:30 for ECG and then will send to the lab for potassium level.

## 2015-07-27 NOTE — Progress Notes (Signed)
Patient sent over from orthopedic with hypokalemia, ECG done and unremarkable, QTC is 460 ms, no ST changes, no T-wave flattening, patient asymptomatic. She will go down stairs for repeat lab check. Already sent in potassium oral.

## 2015-07-27 NOTE — Assessment & Plan Note (Signed)
Noted at ortho office. Rechecking to confirm, adding KCl 40 meq daily.

## 2015-07-27 NOTE — Telephone Encounter (Signed)
That's super low.  She needs to come in for an ECG and repeat potassium levels stat.  I'll add potassium oral 40 meQ daily.

## 2015-09-03 ENCOUNTER — Other Ambulatory Visit: Payer: Self-pay | Admitting: Sports Medicine

## 2015-09-16 ENCOUNTER — Encounter: Payer: Self-pay | Admitting: Sports Medicine

## 2015-09-21 ENCOUNTER — Other Ambulatory Visit: Payer: Self-pay

## 2015-09-21 DIAGNOSIS — E876 Hypokalemia: Secondary | ICD-10-CM

## 2015-09-21 MED ORDER — POTASSIUM CHLORIDE CRYS ER 20 MEQ PO TBCR
40.0000 meq | EXTENDED_RELEASE_TABLET | Freq: Every day | ORAL | Status: DC
Start: 2015-09-21 — End: 2015-09-22

## 2015-09-22 ENCOUNTER — Ambulatory Visit (INDEPENDENT_AMBULATORY_CARE_PROVIDER_SITE_OTHER): Admitting: Sports Medicine

## 2015-09-22 ENCOUNTER — Encounter: Payer: Self-pay | Admitting: Sports Medicine

## 2015-09-22 VITALS — BP 89/60 | HR 73 | Resp 18 | Wt 151.3 lb

## 2015-09-22 DIAGNOSIS — H8113 Benign paroxysmal vertigo, bilateral: Secondary | ICD-10-CM

## 2015-09-22 DIAGNOSIS — H8110 Benign paroxysmal vertigo, unspecified ear: Secondary | ICD-10-CM | POA: Insufficient documentation

## 2015-09-22 MED ORDER — MECLIZINE HCL 25 MG PO TABS
25.0000 mg | ORAL_TABLET | Freq: Three times a day (TID) | ORAL | Status: DC | PRN
Start: 2015-09-22 — End: 2016-01-21

## 2015-09-22 MED ORDER — SCOPOLAMINE 1 MG/3DAYS TD PT72: 1.0000 | MEDICATED_PATCH | TRANSDERMAL | Status: DC

## 2015-09-22 NOTE — Assessment & Plan Note (Signed)
Prednisone, meclizine, scopolamine, vestibular rehabilitation. Return in one month.

## 2015-09-22 NOTE — Patient Instructions (Signed)
Benign Positional Vertigo Vertigo is the feeling that you or your surroundings are moving when they are not. Benign positional vertigo is the most common form of vertigo. The cause of this condition is not serious (is benign). This condition is triggered by certain movements and positions (is positional). This condition can be dangerous if it occurs while you are doing something that could endanger you or others, such as driving.  CAUSES In many cases, the cause of this condition is not known. It may be caused by a disturbance in an area of the inner ear that helps your brain to sense movement and balance. This disturbance can be caused by a viral infection (labyrinthitis), head injury, or repetitive motion. RISK FACTORS This condition is more likely to develop in:  Women.  People who are 50 years of age or older. SYMPTOMS Symptoms of this condition usually happen when you move your head or your eyes in different directions. Symptoms may start suddenly, and they usually last for less than a minute. Symptoms may include:  Loss of balance and falling.  Feeling like you are spinning or moving.  Feeling like your surroundings are spinning or moving.  Nausea and vomiting.  Blurred vision.  Dizziness.  Involuntary eye movement (nystagmus). Symptoms can be mild and cause only slight annoyance, or they can be severe and interfere with daily life. Episodes of benign positional vertigo may return (recur) over time, and they may be triggered by certain movements. Symptoms may improve over time. DIAGNOSIS This condition is usually diagnosed by medical history and a physical exam of the head, neck, and ears. You may be referred to a health care provider who specializes in ear, nose, and throat (ENT) problems (otolaryngologist) or a provider who specializes in disorders of the nervous system (neurologist). You may have additional testing, including:  MRI.  A CT scan.  Eye movement tests. Your  health care provider may ask you to change positions quickly while he or she watches you for symptoms of benign positional vertigo, such as nystagmus. Eye movement may be tested with an electronystagmogram (ENG), caloric stimulation, the Dix-Hallpike test, or the roll test.  An electroencephalogram (EEG). This records electrical activity in your brain.  Hearing tests. TREATMENT Usually, your health care provider will treat this by moving your head in specific positions to adjust your inner ear back to normal. Surgery may be needed in severe cases, but this is rare. In some cases, benign positional vertigo may resolve on its own in 2-4 weeks. HOME CARE INSTRUCTIONS Safety  Move slowly.Avoid sudden body or head movements.  Avoid driving.  Avoid operating heavy machinery.  Avoid doing any tasks that would be dangerous to you or others if a vertigo episode would occur.  If you have trouble walking or keeping your balance, try using a cane for stability. If you feel dizzy or unstable, sit down right away.  Return to your normal activities as told by your health care provider. Ask your health care provider what activities are safe for you. General Instructions  Take over-the-counter and prescription medicines only as told by your health care provider.  Avoid certain positions or movements as told by your health care provider.  Drink enough fluid to keep your urine clear or pale yellow.  Keep all follow-up visits as told by your health care provider. This is important. SEEK MEDICAL CARE IF:  You have a fever.  Your condition gets worse or you develop new symptoms.  Your family or friends   notice any behavioral changes.  Your nausea or vomiting gets worse.  You have numbness or a "pins and needles" sensation. SEEK IMMEDIATE MEDICAL CARE IF:  You have difficulty speaking or moving.  You are always dizzy.  You faint.  You develop severe headaches.  You have weakness in your  legs or arms.  You have changes in your hearing or vision.  You develop a stiff neck.  You develop sensitivity to light.   This information is not intended to replace advice given to you by your health care provider. Make sure you discuss any questions you have with your health care provider.   Document Released: 01/16/2006 Document Revised: 12/30/2014 Document Reviewed: 08/03/2014 Elsevier Interactive Patient Education 2016 [employer]  

## 2015-09-22 NOTE — Progress Notes (Signed)
  Subjective:    CC: vertigo  HPI: For the past 60 year old female hs had dizziness ater turning her head, no presyncopal symptoms, simply nausea and a spinning sensation. No ringing in the years or change in hearing. No focal or lateralizing neurologic symptoms.  Past medical history, Surgical history, Family history not pertinant except as noted below, Social history, Allergies, and medications have been entered into the medical record, reviewed, and no changes needed.   Review of Systems: No fevers, chills, night sweats, weight loss, chest pain, or shortness of breath.   Objective:    General: Well Developed, well nourished, and in no acute distress.  Neuro: Alert and oriented x3, extra-ocular muscles intact, sensation grossly intact. Cranial nerves II through XII are intact, motor, sensory, coordinative functions are intact,I did not perform Dix-Hallpike HEENT: Normocephalic, atraumatic, pupils equal round reactive to light, neck supple, no masses, no lymphadenopathy, thyroid nonpalpable.  Skin: Warm and dry, no rashes. Cardiac: Regular rate and rhythm, no murmurs rubs or gallops, no lower extremity edema.  Respiratory: Clear to auscultation bilaterally. Not using accessory muscles, speaking in full sentences.  Impression and Recommendations:   I spent 25 minutes with this patient, greater than 50% was face-to-face time counseling regarding the above diagnoses

## 2015-09-22 NOTE — Addendum Note (Signed)
Addended by: Baird KayUGLAS, Denarius Sesler M on: 09/22/2015 10:50 AM   Modules accepted: Orders, Medications

## 2015-10-05 ENCOUNTER — Telehealth: Payer: Self-pay

## 2015-10-05 DIAGNOSIS — H8113 Benign paroxysmal vertigo, bilateral: Secondary | ICD-10-CM

## 2015-10-05 NOTE — Telephone Encounter (Signed)
Pt left VM would [facility]. Please assist.

## 2015-10-05 NOTE — Telephone Encounter (Signed)
[facility]

## 2015-10-15 ENCOUNTER — Ambulatory Visit (INDEPENDENT_AMBULATORY_CARE_PROVIDER_SITE_OTHER): Admitting: Sports Medicine

## 2015-10-15 VITALS — BP 114/74 | HR 93 | Resp 16 | Wt 150.6 lb

## 2015-10-15 DIAGNOSIS — D62 Acute posthemorrhagic anemia: Secondary | ICD-10-CM

## 2015-10-15 LAB — POCT HEMOGLOBIN: Hemoglobin: 11.9 g/dL — AB (ref 12.2–16.2)

## 2015-10-15 MED ORDER — DOCUSATE SODIUM 100 MG PO CAPS: 100.0000 mg | ORAL_CAPSULE | Freq: Three times a day (TID) | ORAL | Status: DC | PRN

## 2015-10-15 MED ORDER — FUSION PLUS PO CAPS: 1.0000 | ORAL_CAPSULE | Freq: Every day | ORAL | Status: DC

## 2015-10-15 NOTE — Addendum Note (Signed)
Addended by: Monica BectonHEKKEKANDAM, Tiauna Whisnant J on: 10/15/2015 04:22 PM   Modules accepted: Orders

## 2015-10-15 NOTE — Progress Notes (Signed)
  Subjective:    CC:Low blood pressure  HPI: This is a pleasant [age] year old female, she is post hip arthroplasty, she was told she lost a lot of blood during surgery. Unfortunately she's had significant fatigue, low blood pressure, since then. Has not yet had her anemia checked however previous hemoglobin was 14. Symptoms are moderate, persistent, no headaches, visual changes, chest pain, no shortness of breath, swelling.  Past medical history, Surgical history, Family history not pertinant except as noted below, Social history, Allergies, and medications have been entered into the medical record, reviewed, and no changes needed.   Review of Systems: No fevers, chills, night sweats, weight loss, chest pain, or shortness of breath.   Objective:    General: Well Developed, well nourished, and in no acute distress.  Neuro: Alert and oriented x3, extra-ocular muscles intact, sensation grossly intact.  HEENT: Normocephalic, atraumatic, pupils equal round reactive to light, neck supple, no masses, no lymphadenopathy, thyroid nonpalpable.  Skin: Warm and dry, no rashes. Cardiac: Regular rate and rhythm, no murmurs rubs or gallops, no lower extremity edema.  Respiratory: Clear to auscultation bilaterally. Not using accessory muscles, speaking in full sentences.  Impression and Recommendations:    I spent 25 minutes with this patient, greater than 50% was face-to-face time counseling regarding the above diagnoses

## 2015-10-15 NOTE — Addendum Note (Signed)
Addended by: Baird KayUGLAS, Neala Miggins M on: 10/15/2015 04:53 PM   Modules accepted: Orders, Medications

## 2015-10-15 NOTE — Assessment & Plan Note (Signed)
Post-hip replacement. Advised to liberalize salt in diet, increase consumption of salt-containing electrolyte fluids, and I am adding fusion plus, return in one month, we will recheck hemoglobin.

## 2015-10-17 ENCOUNTER — Other Ambulatory Visit: Payer: Self-pay | Admitting: Sports Medicine

## 2015-10-21 ENCOUNTER — Ambulatory Visit: Payer: Self-pay | Admitting: Sports Medicine

## 2015-10-28 ENCOUNTER — Ambulatory Visit: Payer: Self-pay | Admitting: Sports Medicine

## 2015-11-02 ENCOUNTER — Other Ambulatory Visit: Payer: Self-pay | Admitting: Sports Medicine

## 2015-11-08 ENCOUNTER — Other Ambulatory Visit: Payer: Self-pay

## 2015-11-08 DIAGNOSIS — D62 Acute posthemorrhagic anemia: Secondary | ICD-10-CM

## 2015-11-08 MED ORDER — FUSION PLUS PO CAPS: 1.0000 | ORAL_CAPSULE | Freq: Every day | ORAL | Status: DC

## 2015-11-09 ENCOUNTER — Other Ambulatory Visit: Payer: Self-pay

## 2015-11-09 MED ORDER — SIMVASTATIN 40 MG PO TABS: 40.0000 mg | ORAL_TABLET | Freq: Every evening | ORAL | Status: DC

## 2015-11-12 ENCOUNTER — Encounter: Payer: Self-pay | Admitting: Sports Medicine

## 2015-11-12 ENCOUNTER — Ambulatory Visit (INDEPENDENT_AMBULATORY_CARE_PROVIDER_SITE_OTHER)

## 2015-11-12 ENCOUNTER — Ambulatory Visit (INDEPENDENT_AMBULATORY_CARE_PROVIDER_SITE_OTHER): Admitting: Sports Medicine

## 2015-11-12 VITALS — BP 102/64 | HR 80 | Resp 18 | Wt 151.5 lb

## 2015-11-12 DIAGNOSIS — Z23 Encounter for immunization: Secondary | ICD-10-CM | POA: Diagnosis not present

## 2015-11-12 DIAGNOSIS — M5412 Radiculopathy, cervical region: Secondary | ICD-10-CM

## 2015-11-12 DIAGNOSIS — E785 Hyperlipidemia, unspecified: Secondary | ICD-10-CM | POA: Diagnosis not present

## 2015-11-12 DIAGNOSIS — M5030 Other cervical disc degeneration, unspecified cervical region: Secondary | ICD-10-CM | POA: Insufficient documentation

## 2015-11-12 DIAGNOSIS — M50323 Other cervical disc degeneration at C6-C7 level: Secondary | ICD-10-CM

## 2015-11-12 DIAGNOSIS — D62 Acute posthemorrhagic anemia: Secondary | ICD-10-CM

## 2015-11-12 DIAGNOSIS — Z0000 Encounter for general adult medical examination without abnormal findings: Secondary | ICD-10-CM | POA: Insufficient documentation

## 2015-11-12 NOTE — Assessment & Plan Note (Signed)
Feeling better, continue iron and checking iron indices again.

## 2015-11-12 NOTE — Progress Notes (Signed)
  Subjective:    CC: Follow-up  HPI: Acute blood loss anemia: Continues to improve with iron supplementation  Right arm numbness: Volar, first, second, third fingers, severe neck pain. Symptoms are moderate, persistent without any constitutional symptoms.  Preventive measures: Due for multiple screening measures.  Past medical history, Surgical history, Family history not pertinant except as noted below, Social history, Allergies, and medications have been entered into the medical record, reviewed, and no changes needed.   Review of Systems: No fevers, chills, night sweats, weight loss, chest pain, or shortness of breath.   Objective:    General: Well Developed, well nourished, and in no acute distress.  Neuro: Alert and oriented x3, extra-ocular muscles intact, sensation grossly intact.  HEENT: Normocephalic, atraumatic, pupils equal round reactive to light, neck supple, no masses, no lymphadenopathy, thyroid nonpalpable.  Skin: Warm and dry, no rashes. Cardiac: Regular rate and rhythm, no murmurs rubs or gallops, no lower extremity edema.  Respiratory: Clear to auscultation bilaterally. Not using accessory muscles, speaking in full sentences. Neck: Negative spurling's Full neck range of motion Grip strength and sensation normal in bilateral hands Strength good C4 to T1 distribution No sensory change to C4 to T1 Reflexes normal Right Wrist: Inspection normal with no visible erythema or swelling. ROM smooth and normal with good flexion and extension and ulnar/radial deviation that is symmetrical with opposite wrist. Palpation is normal over metacarpals, navicular, lunate, and TFCC; tendons without tenderness/ swelling No snuffbox tenderness. No tenderness over Canal of Guyon. Strength 5/5 in all directions without pain. Negative Finkelstein, tinel's and phalens. Negative Watson's test.  Impression and Recommendations:    I spent 25 minutes with this patient, greater than 50%  was face-to-face time counseling regarding the above diagnoses

## 2015-11-12 NOTE — Assessment & Plan Note (Signed)
Tetanus vaccine today. Referral for cervical cancer screening, checking routine blood work.

## 2015-11-12 NOTE — Assessment & Plan Note (Signed)
Rechecking lipids and LFTs

## 2015-11-12 NOTE — Assessment & Plan Note (Signed)
Carpal tunnel versus C6 radiculopathy. Physical therapy. X-rays of the neck.  Return in 4 weeks, MRI for interventional planning if no better.

## 2015-11-12 NOTE — Addendum Note (Signed)
Addended by: Baird KayUGLAS, Tierre Gerard M on: 11/12/2015 04:40 PM   Modules accepted: Orders

## 2015-11-16 LAB — HIV ANTIBODY (ROUTINE TESTING W REFLEX): HIV 1&2 Ab, 4th Generation: NONREACTIVE

## 2015-11-16 LAB — VITAMIN B12: Vitamin B-12: 472 pg/mL (ref 200–1100)

## 2015-11-16 LAB — COMPREHENSIVE METABOLIC PANEL
AST: 22 U/L (ref 10–35)
Albumin: 4.2 g/dL (ref 3.6–5.1)
BUN: 9 mg/dL (ref 7–25)
CO2: 27 mmol/L (ref 20–31)
Creat: 0.98 mg/dL (ref 0.50–1.05)
Glucose, Bld: 96 mg/dL (ref 65–99)
Sodium: 140 mmol/L (ref 135–146)
Total Bilirubin: 0.4 mg/dL (ref 0.2–1.2)

## 2015-11-16 LAB — CBC
HCT: 38.1 % (ref 35.0–45.0)
Hemoglobin: 12.3 g/dL (ref 11.7–15.5)
MCH: 29.1 pg (ref 27.0–33.0)
MCHC: 32.3 g/dL (ref 32.0–36.0)
MCV: 90.3 fL (ref 80.0–100.0)
MPV: 10.1 fL (ref 7.5–12.5)
Platelets: 261 K/uL (ref 140–400)
RBC: 4.22 MIL/uL (ref 3.80–5.10)
RDW: 13.8 % (ref 11.0–15.0)
WBC: 4.9 10*3/uL (ref 3.8–10.8)

## 2015-11-16 LAB — LIPID PANEL
Cholesterol: 224 mg/dL — ABNORMAL HIGH (ref 125–200)
HDL: 71 mg/dL (ref >=46)
LDL Cholesterol: 117 mg/dL (ref <130)
Total CHOL/HDL Ratio: 3.2 ratio (ref <=5.0)
Triglycerides: 179 mg/dL — ABNORMAL HIGH (ref <150)
VLDL: 36 mg/dL — ABNORMAL HIGH (ref <30)

## 2015-11-16 LAB — IRON AND TIBC
%SAT: 22 % (ref 11–50)
Iron: 67 ug/dL (ref 45–160)
TIBC: 311 ug/dL (ref 250–450)
UIBC: 244 ug/dL (ref 125–400)

## 2015-11-16 LAB — COMPREHENSIVE METABOLIC PANEL WITH GFR
ALT: 14 U/L (ref 6–29)
Alkaline Phosphatase: 48 U/L (ref 33–130)
Calcium: 9.6 mg/dL (ref 8.6–10.4)
Chloride: 106 mmol/L (ref 98–110)
Potassium: 4.8 mmol/L (ref 3.5–5.3)
Total Protein: 6.8 g/dL (ref 6.1–8.1)

## 2015-11-16 LAB — TSH: TSH: 0.61 m[IU]/L

## 2015-11-16 LAB — FERRITIN: Ferritin: 33 ng/mL (ref 10–232)

## 2015-11-16 LAB — HEPATITIS C ANTIBODY: HCV Ab: NEGATIVE

## 2015-11-16 LAB — FOLATE: Folate: >24 ng/mL (ref >5.4)

## 2016-01-21 ENCOUNTER — Encounter: Payer: Self-pay | Admitting: Sports Medicine

## 2016-01-21 ENCOUNTER — Ambulatory Visit (INDEPENDENT_AMBULATORY_CARE_PROVIDER_SITE_OTHER): Admitting: Sports Medicine

## 2016-01-21 DIAGNOSIS — G2581 Restless legs syndrome: Secondary | ICD-10-CM | POA: Insufficient documentation

## 2016-01-21 DIAGNOSIS — M5030 Other cervical disc degeneration, unspecified cervical region: Secondary | ICD-10-CM | POA: Diagnosis not present

## 2016-01-21 DIAGNOSIS — M810 Age-related osteoporosis without current pathological fracture: Secondary | ICD-10-CM | POA: Diagnosis not present

## 2016-01-21 DIAGNOSIS — E785 Hyperlipidemia, unspecified: Secondary | ICD-10-CM | POA: Diagnosis not present

## 2016-01-21 MED ORDER — IBANDRONATE SODIUM 150 MG PO TABS: 150.0000 mg | ORAL_TABLET | ORAL | 1 refills | Status: DC

## 2016-01-21 MED ORDER — GABAPENTIN 300 MG PO CAPS: 300.0000 mg | ORAL_CAPSULE | Freq: Three times a day (TID) | ORAL | 3 refills | Status: DC

## 2016-01-21 MED ORDER — ROSUVASTATIN CALCIUM 40 MG PO TABS: 40.0000 mg | ORAL_TABLET | Freq: Every day | ORAL | 3 refills | Status: DC

## 2016-01-21 MED ORDER — PREDNISONE 50 MG PO TABS: ORAL_TABLET | ORAL | 0 refills | Status: DC

## 2016-01-21 MED ORDER — CYCLOBENZAPRINE HCL 10 MG PO TABS: ORAL_TABLET | ORAL | 0 refills | Status: DC

## 2016-01-21 NOTE — Progress Notes (Signed)
  Subjective:    CC: Neck pain  HPI: This is a pleasant 60 year old female with cervical degenerative disc disease, pain is moderate, persistent, we gave some medicines including prednisone several months ago but she didn't take them and never followed up. Having a recurrence of pain, moderate, persistent, localized in the neck.  Agreeable to do some physical therapy.  Osteoporosis: Like to switch from Fosamax to Boniva  Leg cramps: I sensation and urge to move the legs at night, predominantly in the calves. Thinks that she may have restless leg syndrome. No evidence of anemia in the labs about a month ago.  Hyperlipidemia: Insufficient control with simvastatin, agreeable to switch to Crestor.  Past medical history:  Negative.  See flowsheet/record as well for more information.  Surgical history: Negative.  See flowsheet/record as well for more information.  Family history: Negative.  See flowsheet/record as well for more information.  Social history: Negative.  See flowsheet/record as well for more information.  Allergies, and medications have been entered into the medical record, reviewed, and no changes needed.   Review of Systems: No fevers, chills, night sweats, weight loss, chest pain, or shortness of breath.   Objective:    General: Well Developed, well nourished, and in no acute distress.  Neuro: Alert and oriented x3, extra-ocular muscles intact, sensation grossly intact.  HEENT: Normocephalic, atraumatic, pupils equal round reactive to light, neck supple, no masses, no lymphadenopathy, thyroid nonpalpable.  Skin: Warm and dry, no rashes. Cardiac: Regular rate and rhythm, no murmurs rubs or gallops, no lower extremity edema.  Respiratory: Clear to auscultation bilaterally. Not using accessory muscles, speaking in full sentences.   Impression and Recommendations:    Hyperlipidemia Switching to Crestor. Recheck lipids in 3 months.  Osteoporosis Desires to switch from  Fosamax to [facility].  DDD (degenerative disc disease), cervical Formal physical therapy for a single visit, prednisone, Flexeril. Return in one month if no better for MRI.  Restless leg syndrome Starting gabapentin considering concurrent cervical DDD. We can switch to Mirapex or Requip if insufficient response. We did check a CBC about a month or 2 ago and there was no evidence of anemia.

## 2016-01-21 NOTE — Assessment & Plan Note (Signed)
Desires to switch from [facility]amax to [facility].

## 2016-01-21 NOTE — Assessment & Plan Note (Signed)
Formal physical therapy for a single visit, prednisone, Flexeril. Return in one month if no better for MRI.

## 2016-01-21 NOTE — Assessment & Plan Note (Signed)
Switching to Crestor. Recheck lipids in 3 months.

## 2016-01-21 NOTE — Assessment & Plan Note (Addendum)
Starting gabapentin considering concurrent cervical DDD. We can switch to Mirapex or Requip if insufficient response. We did check a CBC about a month or 2 ago and there was no evidence of anemia.

## 2016-02-01 ENCOUNTER — Other Ambulatory Visit: Payer: Self-pay | Admitting: Sports Medicine

## 2016-02-01 DIAGNOSIS — Z1231 Encounter for screening mammogram for malignant neoplasm of breast: Secondary | ICD-10-CM

## 2016-02-07 ENCOUNTER — Ambulatory Visit (HOSPITAL_BASED_OUTPATIENT_CLINIC_OR_DEPARTMENT_OTHER): Payer: Self-pay

## 2016-02-07 ENCOUNTER — Ambulatory Visit (HOSPITAL_BASED_OUTPATIENT_CLINIC_OR_DEPARTMENT_OTHER)
Admission: RE | Admit: 2016-02-07 | Discharge: 2016-02-07 | Disposition: A | Discharge status: Home / Self Care | Source: Ambulatory Visit | Attending: Sports Medicine | Admitting: Sports Medicine

## 2016-02-07 DIAGNOSIS — Z1231 Encounter for screening mammogram for malignant neoplasm of breast: Secondary | ICD-10-CM | POA: Insufficient documentation

## 2016-02-11 ENCOUNTER — Other Ambulatory Visit: Payer: Self-pay | Admitting: Sports Medicine

## 2016-02-11 DIAGNOSIS — M5030 Other cervical disc degeneration, unspecified cervical region: Secondary | ICD-10-CM

## 2016-02-22 ENCOUNTER — Other Ambulatory Visit: Payer: Self-pay | Admitting: Sports Medicine

## 2016-02-22 DIAGNOSIS — M5030 Other cervical disc degeneration, unspecified cervical region: Secondary | ICD-10-CM

## 2016-04-12 ENCOUNTER — Other Ambulatory Visit: Payer: Self-pay

## 2016-04-12 DIAGNOSIS — M810 Age-related osteoporosis without current pathological fracture: Secondary | ICD-10-CM

## 2016-04-12 MED ORDER — IBANDRONATE SODIUM 150 MG PO TABS: 150.0000 mg | ORAL_TABLET | ORAL | 3 refills | Status: DC

## 2016-04-13 ENCOUNTER — Other Ambulatory Visit: Payer: Self-pay

## 2016-04-13 DIAGNOSIS — G2581 Restless legs syndrome: Secondary | ICD-10-CM

## 2016-04-13 MED ORDER — GABAPENTIN 300 MG PO CAPS: 300.0000 mg | ORAL_CAPSULE | Freq: Three times a day (TID) | ORAL | 3 refills | Status: DC

## 2016-04-20 ENCOUNTER — Other Ambulatory Visit: Payer: Self-pay | Admitting: Sports Medicine

## 2016-06-01 ENCOUNTER — Ambulatory Visit (INDEPENDENT_AMBULATORY_CARE_PROVIDER_SITE_OTHER): Admitting: Sports Medicine

## 2016-06-01 ENCOUNTER — Ambulatory Visit (INDEPENDENT_AMBULATORY_CARE_PROVIDER_SITE_OTHER)

## 2016-06-01 ENCOUNTER — Encounter: Payer: Self-pay | Admitting: Sports Medicine

## 2016-06-01 DIAGNOSIS — G2581 Restless legs syndrome: Secondary | ICD-10-CM

## 2016-06-01 DIAGNOSIS — M810 Age-related osteoporosis without current pathological fracture: Secondary | ICD-10-CM

## 2016-06-01 DIAGNOSIS — M479 Spondylosis, unspecified: Secondary | ICD-10-CM

## 2016-06-01 DIAGNOSIS — M25561 Pain in right knee: Secondary | ICD-10-CM

## 2016-06-01 DIAGNOSIS — M1711 Unilateral primary osteoarthritis, right knee: Secondary | ICD-10-CM

## 2016-06-01 DIAGNOSIS — E8881 Metabolic syndrome: Secondary | ICD-10-CM | POA: Insufficient documentation

## 2016-06-01 DIAGNOSIS — M7061 Trochanteric bursitis, right hip: Secondary | ICD-10-CM

## 2016-06-01 DIAGNOSIS — Z96642 Presence of left artificial hip joint: Secondary | ICD-10-CM

## 2016-06-01 DIAGNOSIS — E669 Obesity, unspecified: Secondary | ICD-10-CM | POA: Insufficient documentation

## 2016-06-01 MED ORDER — DICLOFENAC SODIUM 75 MG PO TBEC: 75.0000 mg | DELAYED_RELEASE_TABLET | Freq: Two times a day (BID) | ORAL | 3 refills | Status: AC

## 2016-06-01 MED ORDER — GABAPENTIN 300 MG PO CAPS: 300.0000 mg | ORAL_CAPSULE | Freq: Three times a day (TID) | ORAL | 3 refills | Status: DC

## 2016-06-01 MED ORDER — NALTREXONE-BUPROPION HCL ER 8-90 MG PO TB12: ORAL_TABLET | ORAL | 0 refills | Status: DC

## 2016-06-01 MED ORDER — IBANDRONATE SODIUM 150 MG PO TABS: 150.0000 mg | ORAL_TABLET | ORAL | 3 refills | Status: DC

## 2016-06-01 NOTE — Progress Notes (Signed)
  Subjective:    CC: Joint pain  HPI: This is a pleasant 61 year old female with a history of left hip replacement and osteoarthritis who presents for right hip pain and right knee pain. She states over the last several months she has been experiencing hip and knee pain exacerbated by walking and standing. She denies numbness, tingling, weakness, and decreased ROM of the affected leg. Reports approximately 35 pound weight gain over the past 10 months and believes this has greatly contributed to the pain. Denies history of trauma to either joint.   Past medical history:  Negative.  See flowsheet/record as well for more information.  Surgical history: Negative.  See flowsheet/record as well for more information.  Family history: Negative.  See flowsheet/record as well for more information.  Social history: Negative.  See flowsheet/record as well for more information.  Allergies, and medications have been entered into the medical record, reviewed, and no changes needed.   Review of Systems: No fevers, chills, night sweats, weight loss, chest pain, or shortness of breath.   Objective:    General: Well Developed, well nourished, and in no acute distress.  Neuro: Alert and oriented x3, extra-ocular muscles intact, sensation grossly intact.  HEENT: Normocephalic, atraumatic, pupils equal round reactive to light, neck supple, no masses, no lymphadenopathy, thyroid nonpalpable.  Skin: Warm and dry, no rashes. Cardiac: Regular rate and rhythm, no murmurs rubs or gallops, no lower extremity edema.  Respiratory: Clear to auscultation bilaterally. Not using accessory muscles, speaking in full sentences. Musculoskeletal: Right Hip: TTP over greater trochanter. Full AROM. Faber test negative. No erythema or warmth noted. Right Knee: Moderatee tenderness over medial joint line and mild tenderness of lateral joint line. Full AROM. No effusions, warmth, or obvious deformities noted.   Impression and  Recommendations:    Trochanteric bursitis, right hip Physical therapy, x-rays, Voltaren  Primary osteoarthritis of right knee X-rays, Voltaren, weight loss  Morbid obesity (HCC) Starting Contrave, return in one month to evaluate tolerance.

## 2016-06-01 NOTE — Assessment & Plan Note (Signed)
Physical therapy, x-rays, Voltaren

## 2016-06-01 NOTE — Assessment & Plan Note (Signed)
X-rays, Voltaren, weight loss

## 2016-06-01 NOTE — Assessment & Plan Note (Signed)
Starting Contrave, return in one month to evaluate tolerance.

## 2016-06-12 ENCOUNTER — Telehealth: Payer: Self-pay

## 2016-06-12 NOTE — Telephone Encounter (Signed)
Pre Authorization was sent to Cover My Meds. Key: [id number]

## 2016-06-16 ENCOUNTER — Other Ambulatory Visit: Payer: Self-pay | Admitting: Sports Medicine

## 2016-08-16 ENCOUNTER — Encounter: Payer: Self-pay | Admitting: *Deleted

## 2016-08-16 ENCOUNTER — Emergency Department (INDEPENDENT_AMBULATORY_CARE_PROVIDER_SITE_OTHER)
Admission: EM | Admit: 2016-08-16 | Discharge: 2016-08-16 | Disposition: A | Discharge status: Home / Self Care | Source: Home / Self Care | Attending: Family Medicine | Admitting: Family Medicine

## 2016-08-16 ENCOUNTER — Encounter: Payer: Self-pay | Admitting: Sports Medicine

## 2016-08-16 DIAGNOSIS — R3915 Urgency of urination: Secondary | ICD-10-CM

## 2016-08-16 DIAGNOSIS — R300 Dysuria: Secondary | ICD-10-CM | POA: Diagnosis not present

## 2016-08-16 MED ORDER — CEPHALEXIN 500 MG PO CAPS
500.0000 mg | ORAL_CAPSULE | Freq: Two times a day (BID) | ORAL | 0 refills | Status: DC
Start: 2016-08-16 — End: 2017-07-23

## 2016-08-16 NOTE — ED Triage Notes (Signed)
Pt c/o dysuria, urinary urgency, and HA x 1430 today. Denies fever. Took AZO, Excedrin and one tablet of PCN at 1530.

## 2016-08-16 NOTE — ED Provider Notes (Signed)
CSN: [id number]     Arrival date & time 08/16/16  1837 History   First MD Initiated Contact with Patient 08/16/16 1939     Chief Complaint  Patient presents with  . Dysuria   (Consider location/radiation/quality/duration/timing/severity/associated sxs/prior Treatment) HPI  Ann [name] is a 61 y.o. female presenting to UC with c/o sudden onset dysuria, urinary urgency, and generalized headache since 1430 today.  Hx of UTIs, last one was a few years ago.  She took Azo, Excedrin and 1 tablet of out dated PCN at 1530 but is not feeling much better. Denies hematuria. Denies abdominal pain, back pain, fever, chills, n/v/d. No vaginal symptoms.    Past Medical History:  Diagnosis Date  . Alcoholism (HCC)   . Degenerative disc disease   . Hyperlipemia   . Osteoporosis    Past Surgical History:  Procedure Laterality Date  . CYSTOCELE REPAIR    . TUBAL LIGATION     Family History  Problem Relation Age of Onset  . Cancer Mother     breast  . Alcohol abuse Mother   . Cancer Father     prostate  . Alcohol abuse Paternal Uncle   . Alcohol abuse Maternal Grandfather   . Alcohol abuse Paternal Grandfather    Social History  Substance Use Topics  . Smoking status: Former Smoker    Packs/day: 0.50    Years: 40.00    Types: Cigarettes  . Smokeless tobacco: Never Used  . Alcohol use 42.0 oz/week    70 Cans of beer per week     Comment: 8-9 drinks daily   OB History    No data available     Review of Systems  Constitutional: Negative for chills and fever.  Respiratory: Negative for cough and shortness of breath.   Cardiovascular: Negative for chest pain and palpitations.  Gastrointestinal: Negative for abdominal pain, diarrhea, nausea and vomiting.  Genitourinary: Positive for dysuria, frequency and urgency. Negative for flank pain, hematuria and pelvic pain.  Musculoskeletal: Negative for arthralgias, back pain and myalgias.  Skin: Negative for rash.  Neurological: Positive for  headaches. Negative for dizziness and light-headedness.    Allergies  Naproxen and Desyrel [trazodone]  Home Medications   Prior to Admission medications   Medication Sig Start Date End Date Taking? Authorizing Provider  cephALEXin (KEFLEX) 500 MG capsule Take 1 capsule (500 mg total) by mouth 2 (two) times daily. 08/16/16   Junius Finner, PA-C  CHANTIX 1 MG tablet TAKE 1 TABLET TWICE A DAY 11/02/15   Monica Becton, MD  diclofenac (VOLTAREN) 75 MG EC tablet Take 1 tablet (75 mg total) by mouth 2 (two) times daily. 06/01/16 06/01/17  Monica Becton, MD  gabapentin (NEURONTIN) 300 MG capsule Take 1 capsule (300 mg total) by mouth 3 (three) times daily. 06/01/16   Monica Becton, MD  ibandronate (BONIVA) 150 MG tablet Take 1 tablet (150 mg total) by mouth every 30 (thirty) days. Take qAM w/water, on empty stomach,dont lie down for 30 min. 06/01/16   Monica Becton, MD  Multiple Vitamin (MULTIVITAMIN WITH MINERALS) TABS tablet Take 1 tablet by mouth daily. 12/14/14   Thermon Leyland, NP  Naltrexone-Bupropion HCl ER 8-90 MG TB12 1 tab daily for week 1, then 1 tab BID for week 2, then 2 tab PO qAM and 1 tab PO qPM for week 3, then 2 tabs BID. 06/01/16   Monica Becton, MD  sertraline (ZOLOFT) 50 MG tablet TAKE 1 TABLET DAILY  06/16/16   Monica Becton, MD  simvastatin (ZOCOR) 40 MG tablet TAKE 1 TABLET EVERY EVENING 04/20/16   Monica Becton, MD   Meds Ordered and Administered this Visit  Medications - No data to display  BP 131/77 (BP Location: Left Arm)   Pulse 77   Temp 98.9 F (37.2 C) (Oral)   Resp 16   Ht  (1.575 m)   Wt 184 lb (83.5 kg)   SpO2 96%   BMI 33.65 kg/m  No data found.   Physical Exam  Constitutional: She is oriented to person, place, and time. She appears well-developed and well-nourished. No distress.  HENT:  Head: Normocephalic and atraumatic.  Mouth/Throat: Oropharynx is clear and moist.  Eyes: EOM are normal.  Neck:  Normal range of motion.  Cardiovascular: Normal rate and regular rhythm.   Pulmonary/Chest: Effort normal and breath sounds normal. No respiratory distress. She has no wheezes. She has no rales.  Abdominal: Soft. She exhibits no distension and no mass. There is no tenderness. There is no rebound, no guarding and no CVA tenderness.  Musculoskeletal: Normal range of motion.  Neurological: She is alert and oriented to person, place, and time.  Skin: Skin is warm and dry. She is not diaphoretic.  Psychiatric: She has a normal mood and affect. Her behavior is normal.  Nursing note and vitals reviewed.   Urgent Care Course     Procedures (including critical care time)  Labs Review Labs Reviewed  URINE CULTURE    Imaging Review No results found.    MDM   1. Dysuria   2. Urinary urgency    Pt c/o sudden onset urinary symptoms c/w prior UTIs. No UA done in UC as she has already taken Azo and PCN. Urine culture sent. Will start pt on Keflex while culture pending.  f/u with PCP in 4-5 days if not improving.    Junius Finner, PA-C 08/17/16 [phone]

## 2016-08-18 ENCOUNTER — Telehealth: Payer: Self-pay | Admitting: Emergency Medicine

## 2016-08-18 LAB — URINE CULTURE: Organism ID, Bacteria: NO GROWTH

## 2016-08-18 NOTE — Telephone Encounter (Signed)
Patient informed of Negative Results. Advised to call back with questions or concerns.  

## 2016-10-17 ENCOUNTER — Other Ambulatory Visit: Payer: Self-pay | Admitting: Sports Medicine

## 2017-04-04 ENCOUNTER — Telehealth: Payer: Self-pay | Admitting: Sports Medicine

## 2017-04-04 NOTE — Telephone Encounter (Signed)
Pt called after hours line stating she thinks she has a UTI. Attempted to contact Pt and advise she needs an appt. No answer and no VM set up.

## 2017-04-06 ENCOUNTER — Other Ambulatory Visit: Payer: Self-pay | Admitting: Sports Medicine

## 2017-07-23 ENCOUNTER — Emergency Department (INDEPENDENT_AMBULATORY_CARE_PROVIDER_SITE_OTHER)
Admission: EM | Admit: 2017-07-23 | Discharge: 2017-07-23 | Disposition: A | Discharge status: Home / Self Care | Source: Home / Self Care | Attending: Family Medicine | Admitting: Family Medicine

## 2017-07-23 DIAGNOSIS — N3001 Acute cystitis with hematuria: Secondary | ICD-10-CM | POA: Diagnosis not present

## 2017-07-23 DIAGNOSIS — R300 Dysuria: Secondary | ICD-10-CM | POA: Diagnosis not present

## 2017-07-23 LAB — POCT URINALYSIS DIP (MANUAL ENTRY)
BILIRUBIN UA: NEGATIVE
BILIRUBIN UA: NEGATIVE mg/dL
GLUCOSE UA: NEGATIVE mg/dL
NITRITE UA: NEGATIVE
Protein Ur, POC: NEGATIVE mg/dL
Spec Grav, UA: <=1.005 — AB (ref 1.010–1.025)
Urobilinogen, UA: 0.2 E.U./dL
pH, UA: 6 (ref 5.0–8.0)

## 2017-07-23 MED ORDER — NITROFURANTOIN MONOHYD MACRO 100 MG PO CAPS: 100.0000 mg | ORAL_CAPSULE | Freq: Two times a day (BID) | ORAL | 0 refills | Status: DC

## 2017-07-23 MED ORDER — PHENAZOPYRIDINE HCL 200 MG PO TABS: 200.0000 mg | ORAL_TABLET | Freq: Three times a day (TID) | ORAL | 0 refills | Status: DC

## 2017-07-23 NOTE — ED Triage Notes (Signed)
Pt with painful urination, hematuria, and increased frequency today.

## 2017-07-23 NOTE — Discharge Instructions (Addendum)
Increase fluid intake. °If symptoms become significantly worse during the night or over the weekend, proceed to the local emergency room.  °
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
## 2017-07-24 LAB — URINE CULTURE
MICRO NUMBER: 90401040
SPECIMEN QUALITY:: ADEQUATE

## 2017-07-25 ENCOUNTER — Telehealth: Payer: Self-pay

## 2017-07-25 NOTE — Telephone Encounter (Signed)
Left message on VM with lab results and call back information for questions and concerns.

## 2017-07-26 ENCOUNTER — Other Ambulatory Visit: Payer: Self-pay | Admitting: Sports Medicine

## 2017-07-26 DIAGNOSIS — M810 Age-related osteoporosis without current pathological fracture: Secondary | ICD-10-CM

## 2017-08-07 ENCOUNTER — Encounter: Payer: Self-pay | Admitting: Sports Medicine

## 2017-08-07 ENCOUNTER — Ambulatory Visit (INDEPENDENT_AMBULATORY_CARE_PROVIDER_SITE_OTHER): Admitting: Sports Medicine

## 2017-08-07 DIAGNOSIS — R3915 Urgency of urination: Secondary | ICD-10-CM | POA: Insufficient documentation

## 2017-08-07 MED ORDER — SULFAMETHOXAZOLE-TRIMETHOPRIM 800-160 MG PO TABS: 1.0000 | ORAL_TABLET | Freq: Two times a day (BID) | ORAL | 0 refills | Status: DC

## 2017-08-07 MED ORDER — PHENAZOPYRIDINE HCL 200 MG PO TABS: 200.0000 mg | ORAL_TABLET | Freq: Three times a day (TID) | ORAL | 0 refills | Status: AC

## 2017-08-07 NOTE — Assessment & Plan Note (Signed)
Is finished a course of Macrobid, slightly better when on the antibiotic. Now with recurrence of symptoms but a urinalysis that is negative for everything with the exception of protein. She likely still has some detrusor instability secondary to cystitis. Increasing Pyridium, restarting antibiotics, Septra, 10-day course. Urinary tract ultrasound considering duration of symptoms as well as right flank pain. Return to see me in 2 weeks, referral for cystoscopy and I will probably add Myrbetriq if no better.

## 2017-08-07 NOTE — Progress Notes (Signed)
Subjective:    CC: blood in urine, pain with urination   HPI: Ann [name], a 62yo female, with a pmh significant for a rectocele, and  cystocele -from 4564yrs ago-for which she received a mesh bladder,  presents today with cc of blood in urine and pain with urination that peaks at the end of the stream and is often accompanied by a  Sensation of spasms of the bladder. Pt reports that incontinence also occurs with the pain.   Pt reports that the pain and blood began on April 1st for which she visited urgent care to be evaluated. Pt reports that she was prescribed antibiotics and was informed a few days later that her urinalysis was benign . Pt reports that she tapered her ingestion of antibiotics before stopping all together- prior to completion of the prescribed course.  However  on Friday April 12th the pain with urination, blood in urine, and incontinence  returned.  Pt reports that she took 2 capsules  of macrobid on Friday April 12th  and 1 macrobid capsule every day after Friday. Pt reports that pain diminished with adminsitration of antibiotics and urinary analgesic. Pt also reports that presence of blood in urine dissipates within 24 hours of starting and restarting antibiotic.  Pt reports unsuccessful utilization of ibuprofen to alleviate the pain. Pt denies fever, changes in diet, and changes in defecation habits. Lastly pt reports that her last menstruation cycle was over 10 years ago.   I reviewed the past medical history, family history, social history, surgical history, and allergies today and no changes were needed.  Please see the problem list section below in epic for further details.  Past Medical History: Past Medical History:  Diagnosis Date  . Alcoholism (HCC)   . Degenerative disc disease   . Hyperlipemia   . Osteoporosis    Past Surgical History: Past Surgical History:  Procedure Laterality Date  . CYSTOCELE REPAIR    . TUBAL LIGATION     Social History: Social History    Socioeconomic History  . Marital status: Married    Spouse name: Not on file  . Number of children: Not on file  . Years of education: Not on file  . Highest education level: Not on file  Occupational History  . Not on file  Social Needs  . Financial resource strain: Not on file  . Food insecurity:    Worry: Not on file    Inability: Not on file  . Transportation needs:    Medical: Not on file    Non-medical: Not on file  Tobacco Use  . Smoking status: Former Smoker    Packs/day: 0.50    Years: 40.00    Pack years: 20.00    Types: Cigarettes  . Smokeless tobacco: Never Used  Substance and Sexual Activity  . Alcohol use: Yes    Alcohol/week: 42.0 oz    Types: 70 Cans of beer per week    Comment: 8-9 drinks daily  . Drug use: No  . Sexual activity: Not on file  Lifestyle  . Physical activity:    Days per week: Not on file    Minutes per session: Not on file  . Stress: Not on file  Relationships  . Social connections:    Talks on phone: Not on file    Gets together: Not on file    Attends religious service: Not on file    Active member of club or organization: Not on file    Attends  meetings of clubs or organizations: Not on file    Relationship status: Not on file  Other Topics Concern  . Not on file  Social History Narrative  . Not on file   Family History: Family History  Problem Relation Age of Onset  . Cancer Mother        breast  . Alcohol abuse Mother   . Cancer Father        prostate  . Alcohol abuse Paternal Uncle   . Alcohol abuse Maternal Grandfather   . Alcohol abuse Paternal Grandfather    Allergies: Allergies  Allergen Reactions  . Naproxen Anaphylaxis, Other (See Comments) and Swelling    Throat Red in the face within seconds   . Desyrel [Trazodone] Other (See Comments)    Nightmares and sweats   Medications: See med rec.  Review of Systems: No fevers, chills, night sweats, weight loss, chest pain, or shortness of breath.    Objective:    General: Well Developed, well nourished, and in no acute distress.  Neuro: Alert and oriented x3, extra-ocular muscles intact, sensation grossly intact.  HEENT: Normocephalic, atraumatic, pupils equal round reactive to light, neck supple, no masses, no lymphadenopathy, thyroid nonpalpable.  Skin: Warm and dry, no rashes. Cardiac: Regular rate and rhythm, no murmurs rubs or gallops, no lower extremity edema.  Respiratory: Clear to auscultation bilaterally. Not using accessory muscles, speaking in full sentences. Abdomen: no signs of bruising or trauma, but  Abdomen appears mildly distended;  tender to deep palpation on the right side; negative rebound tenderness; no rigidity ' GU: no suprapubic tenderness.   Costovertebral angle tenderness on right side    UA: Proteins present   Impression and Recommendations:    Assessment-Cystitis /and Overactive Bladder In the past, patient has grown out E.coli, so an extended course of antibiotics to improve symptoms of urgency, dysuria has been prescribed specific to treating E. Coli ([id number])  Urinary Analgesic (Pyridium) continued to counter symptoms of dysuria  Imaging (Ultrasound):  Duration of cystitis is atypical (approx 2 weeks) so further work up is indicated  to rule out more serious  pathologies.  Px of positive Murphy punch sign on the right leaves me suspicious of a fomenting  pylonepephritis   Pt advised to follow up in 2 weeks if symptoms have not improved.  Pt was informed that if symptoms do not improve, further imaging would be indicated (cytoscopy) to visualize bladder wall as well as medication to address the sensation of bladder spasms.   Pt reports that she also has apt scheduled with urogyn on May 6th to address symptoms.    ___________________________________________ Ihor Austin. Benjamin Stain, M.D., ABFM., CAQSM. Primary Care and Sports Medicine [facility]  Adjunct Instructor of Family  Medicine  [employer]

## 2017-08-08 ENCOUNTER — Ambulatory Visit (INDEPENDENT_AMBULATORY_CARE_PROVIDER_SITE_OTHER)

## 2017-08-08 DIAGNOSIS — N281 Cyst of kidney, acquired: Secondary | ICD-10-CM | POA: Diagnosis not present

## 2017-08-08 DIAGNOSIS — R3915 Urgency of urination: Secondary | ICD-10-CM

## 2017-08-08 LAB — URINE CULTURE
MICRO NUMBER:: 90469079
SPECIMEN QUALITY:: ADEQUATE

## 2017-08-15 ENCOUNTER — Encounter: Payer: Self-pay | Admitting: Sports Medicine

## 2017-08-21 ENCOUNTER — Telehealth: Payer: Self-pay | Admitting: Sports Medicine

## 2017-08-21 ENCOUNTER — Ambulatory Visit (INDEPENDENT_AMBULATORY_CARE_PROVIDER_SITE_OTHER): Admitting: Sports Medicine

## 2017-08-21 ENCOUNTER — Encounter: Payer: Self-pay | Admitting: Sports Medicine

## 2017-08-21 DIAGNOSIS — R140 Abdominal distension (gaseous): Secondary | ICD-10-CM

## 2017-08-21 DIAGNOSIS — E785 Hyperlipidemia, unspecified: Secondary | ICD-10-CM

## 2017-08-21 DIAGNOSIS — R3915 Urgency of urination: Secondary | ICD-10-CM

## 2017-08-21 LAB — COMPREHENSIVE METABOLIC PANEL
AG Ratio: 1.6 (calc) (ref 1.0–2.5)
ALT: 16 U/L (ref 6–29)
Albumin: 4.4 g/dL (ref 3.6–5.1)
BUN/Creatinine Ratio: 9 (calc) (ref 6–22)
BUN: 10 mg/dL (ref 7–25)
CO2: 27 mmol/L (ref 20–32)
Glucose, Bld: 96 mg/dL (ref 65–99)
Potassium: 4 mmol/L (ref 3.5–5.3)
Sodium: 139 mmol/L (ref 135–146)
Total Bilirubin: 0.5 mg/dL (ref 0.2–1.2)
Total Protein: 7.1 g/dL (ref 6.1–8.1)

## 2017-08-21 LAB — COMPREHENSIVE METABOLIC PANEL WITH GFR
AST: 22 U/L (ref 10–35)
Alkaline phosphatase (APISO): 42 U/L (ref 33–130)
Calcium: 9.5 mg/dL (ref 8.6–10.4)
Chloride: 106 mmol/L (ref 98–110)
Creat: 1.13 mg/dL — ABNORMAL HIGH (ref 0.50–0.99)
Globulin: 2.7 g/dL (ref 1.9–3.7)

## 2017-08-21 LAB — LIPID PANEL W/REFLEX DIRECT LDL
Cholesterol: 201 mg/dL — ABNORMAL HIGH (ref <200)
HDL: 71 mg/dL (ref >50)
LDL Cholesterol (Calc): 105 mg/dL (calc) — ABNORMAL HIGH
Non-HDL Cholesterol (Calc): 130 mg/dL (calc) — ABNORMAL HIGH (ref <130)
Total CHOL/HDL Ratio: 2.8 (calc) (ref <5.0)
Triglycerides: 137 mg/dL (ref <150)

## 2017-08-21 LAB — CBC
HCT: 36.1 % (ref 35.0–45.0)
Hemoglobin: 12.2 g/dL (ref 11.7–15.5)
MCH: 31.8 pg (ref 27.0–33.0)
MCHC: 33.8 g/dL (ref 32.0–36.0)
MCV: 94 fL (ref 80.0–100.0)
MPV: 10.7 fL (ref 7.5–12.5)
Platelets: 286 Thousand/uL (ref 140–400)
RBC: 3.84 10*6/uL (ref 3.80–5.10)
RDW: 11.5 % (ref 11.0–15.0)
WBC: 5.4 10*3/uL (ref 3.8–10.8)

## 2017-08-21 MED ORDER — ACAMPROSATE CALCIUM 333 MG PO TBEC: 666.0000 mg | DELAYED_RELEASE_TABLET | Freq: Three times a day (TID) | ORAL | 11 refills | Status: DC

## 2017-08-21 MED ORDER — PANTOPRAZOLE SODIUM 40 MG PO TBEC
40.0000 mg | DELAYED_RELEASE_TABLET | Freq: Two times a day (BID) | ORAL | 3 refills | Status: DC
Start: 2017-08-21 — End: 2017-08-28

## 2017-08-21 MED ORDER — MIRABEGRON ER 50 MG PO TB24: 25.0000 mg | ORAL_TABLET | Freq: Every day | ORAL | 11 refills | Status: DC

## 2017-08-21 MED ORDER — OXYBUTYNIN CHLORIDE ER 5 MG PO TB24: ORAL_TABLET | ORAL | 3 refills | Status: DC

## 2017-08-21 NOTE — Telephone Encounter (Signed)
Switching to oxybutynin 

## 2017-08-21 NOTE — Progress Notes (Signed)
Subjective:    CC: Pelvic discomfort  HPI: This is a pleasant 62 year old female, for a long time now she has had on and off symptoms of urgency, urinary frequency, dysuria.  We have given her several courses of antibiotics which seemed to relieve her symptoms shortly.  She has always had negative urine cultures, and urinalyses have also been for the most part negative.  She does have a history of a rectocele.  No fevers, chills, no flank pain.  Bloating: Fairly diffuse, but also in the epigastric region, worsened with intake of food and water.  No melena, hematochezia.  We did treat her husband for H. pylori infection several years ago.  I reviewed the past medical history, family history, social history, surgical history, and allergies today and no changes were needed.  Please see the problem list section below in epic for further details.  Past Medical History: Past Medical History:  Diagnosis Date  . Alcoholism (HCC)   . Degenerative disc disease   . Hyperlipemia   . Osteoporosis    Past Surgical History: Past Surgical History:  Procedure Laterality Date  . CYSTOCELE REPAIR    . TUBAL LIGATION     Social History: Social History   Socioeconomic History  . Marital status: Married    Spouse name: Not on file  . Number of children: Not on file  . Years of education: Not on file  . Highest education level: Not on file  Occupational History  . Not on file  Social Needs  . Financial resource strain: Not on file  . Food insecurity:    Worry: Not on file    Inability: Not on file  . Transportation needs:    Medical: Not on file    Non-medical: Not on file  Tobacco Use  . Smoking status: Former Smoker    Packs/day: 0.50    Years: 40.00    Pack years: 20.00    Types: Cigarettes  . Smokeless tobacco: Never Used  Substance and Sexual Activity  . Alcohol use: Yes    Alcohol/week: 42.0 oz    Types: 70 Cans of beer per week    Comment: 8-9 drinks daily  . Drug use: No    . Sexual activity: Not on file  Lifestyle  . Physical activity:    Days per week: Not on file    Minutes per session: Not on file  . Stress: Not on file  Relationships  . Social connections:    Talks on phone: Not on file    Gets together: Not on file    Attends religious service: Not on file    Active member of club or organization: Not on file    Attends meetings of clubs or organizations: Not on file    Relationship status: Not on file  Other Topics Concern  . Not on file  Social History Narrative  . Not on file   Family History: Family History  Problem Relation Age of Onset  . Cancer Mother        breast  . Alcohol abuse Mother   . Cancer Father        prostate  . Alcohol abuse Paternal Uncle   . Alcohol abuse Maternal Grandfather   . Alcohol abuse Paternal Grandfather    Allergies: Allergies  Allergen Reactions  . Naproxen Anaphylaxis, Other (See Comments) and Swelling    Throat Red in the face within seconds   . Desyrel [Trazodone] Other (See Comments)  Nightmares and sweats   Medications: See med rec.  Review of Systems: No fevers, chills, night sweats, weight loss, chest pain, or shortness of breath.   Objective:    General: Well Developed, well nourished, and in no acute distress.  Neuro: Alert and oriented x3, extra-ocular muscles intact, sensation grossly intact.  HEENT: Normocephalic, atraumatic, pupils equal round reactive to light, neck supple, no masses, no lymphadenopathy, thyroid nonpalpable.  Skin: Warm and dry, no rashes. Cardiac: Regular rate and rhythm, no murmurs rubs or gallops, no lower extremity edema.  Respiratory: Clear to auscultation bilaterally. Not using accessory muscles, speaking in full sentences.  Urinalysis is completely negative.  Impression and Recommendations:    Hyperlipidemia Checking routine labs  Urinary urgency At this point we have treated multiple times with antibiotics, cultures have been negative, I now  have more of a suspicion for interstitial cystitis versus overactive bladder. Referral to urology for consideration of cystoscopy, also doing a free trial of Myrbetriq. Return to see me in 2 weeks.  Myrbetriq not available for her due to government insurance, switching to oxybutynin.  Abdominal bloating H. pylori breath test, Protonix. Return in 2 weeks.  ___________________________________________ Ihor Austin. Benjamin Stain, M.D., ABFM., CAQSM. Primary Care and Sports Medicine [facility]  Adjunct Instructor of Family Medicine  [employer]

## 2017-08-21 NOTE — Telephone Encounter (Signed)
I spoke with patient about keeping the prescription for Myrbetriq. The prescription card provided to her to get the first month free stated that the card did not pay for prescriptions covered by federal programs, such as Tricare. Cash paying patients also do not qualify to use the coupon. Patient is insisting that her prescription be changed to Oxybutynin. Please advise.

## 2017-08-21 NOTE — Telephone Encounter (Signed)
Patient went to pharmacy to fill her Myrbetriq prescription. The pharmacist told her that the coupon she had was expired and that Oxybutynin is an alternative, similar medication that would be covered by her insurance. Please advise. Thanks!

## 2017-08-21 NOTE — Assessment & Plan Note (Signed)
Checking routine labs 

## 2017-08-21 NOTE — Addendum Note (Signed)
Addended by: Monica Becton on: 08/21/2017 04:52 PM   Modules accepted: Orders

## 2017-08-21 NOTE — Assessment & Plan Note (Addendum)
At this point we have treated multiple times with antibiotics, cultures have been negative, I now have more of a suspicion for interstitial cystitis versus overactive bladder. Referral to urology for consideration of cystoscopy, also doing a free trial of Myrbetriq. Return to see me in 2 weeks.  Myrbetriq not available for her due to government insurance, switching to oxybutynin.

## 2017-08-21 NOTE — Assessment & Plan Note (Signed)
H. pylori breath test, Protonix. Return in 2 weeks.

## 2017-08-22 LAB — HEMOGLOBIN A1C
Hgb A1c MFr Bld: 5.4 %{Hb} (ref <5.7)
Mean Plasma Glucose: 108 (calc)
eAG (mmol/L): 6 (calc)

## 2017-08-22 LAB — TSH: TSH: 0.87 m[IU]/L (ref 0.40–4.50)

## 2017-08-23 LAB — H. PYLORI BREATH TEST: H. pylori Breath Test: NOT DETECTED

## 2017-08-28 ENCOUNTER — Other Ambulatory Visit: Payer: Self-pay

## 2017-08-28 DIAGNOSIS — R140 Abdominal distension (gaseous): Secondary | ICD-10-CM

## 2017-08-28 MED ORDER — ACAMPROSATE CALCIUM 333 MG PO TBEC: 666.0000 mg | DELAYED_RELEASE_TABLET | Freq: Three times a day (TID) | ORAL | 1 refills | Status: DC

## 2017-08-28 MED ORDER — PANTOPRAZOLE SODIUM 40 MG PO TBEC: 40.0000 mg | DELAYED_RELEASE_TABLET | Freq: Two times a day (BID) | ORAL | 1 refills | Status: DC

## 2017-08-28 MED ORDER — OXYBUTYNIN CHLORIDE ER 5 MG PO TB24: ORAL_TABLET | ORAL | 1 refills | Status: DC

## 2017-09-03 ENCOUNTER — Other Ambulatory Visit: Payer: Self-pay | Admitting: Sports Medicine

## 2017-09-03 DIAGNOSIS — Z1231 Encounter for screening mammogram for malignant neoplasm of breast: Secondary | ICD-10-CM

## 2017-09-06 ENCOUNTER — Ambulatory Visit (HOSPITAL_BASED_OUTPATIENT_CLINIC_OR_DEPARTMENT_OTHER)
Admission: RE | Admit: 2017-09-06 | Discharge: 2017-09-06 | Disposition: A | Discharge status: Home / Self Care | Source: Ambulatory Visit | Attending: Sports Medicine | Admitting: Sports Medicine

## 2017-09-06 DIAGNOSIS — Z1231 Encounter for screening mammogram for malignant neoplasm of breast: Secondary | ICD-10-CM | POA: Diagnosis not present

## 2017-11-14 ENCOUNTER — Emergency Department (INDEPENDENT_AMBULATORY_CARE_PROVIDER_SITE_OTHER)
Admission: EM | Admit: 2017-11-14 | Discharge: 2017-11-14 | Disposition: A | Discharge status: Home / Self Care | Source: Home / Self Care | Attending: Family Medicine | Admitting: Family Medicine

## 2017-11-14 ENCOUNTER — Other Ambulatory Visit: Payer: Self-pay

## 2017-11-14 ENCOUNTER — Encounter: Payer: Self-pay | Admitting: *Deleted

## 2017-11-14 DIAGNOSIS — B370 Candidal stomatitis: Secondary | ICD-10-CM | POA: Diagnosis not present

## 2017-11-14 MED ORDER — CLOTRIMAZOLE 10 MG MT TROC: 10.0000 mg | Freq: Every day | OROMUCOSAL | 0 refills | Status: DC

## 2017-11-14 NOTE — ED Triage Notes (Signed)
Patient c/o 3 days of mouth pain, redness and lesions. No recent med changes or antibiotic use.
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
## 2018-02-13 IMAGING — DX DG CERVICAL SPINE COMPLETE 4+V
5 series · 5 of 5 positions shown · non-contrast
Comparison: None.

CLINICAL DATA: Patient with chronic neck pain. No known injury.
Progressively worsening.

EXAM:
CERVICAL SPINE - COMPLETE 4+ VIEW

[c-spine lat]
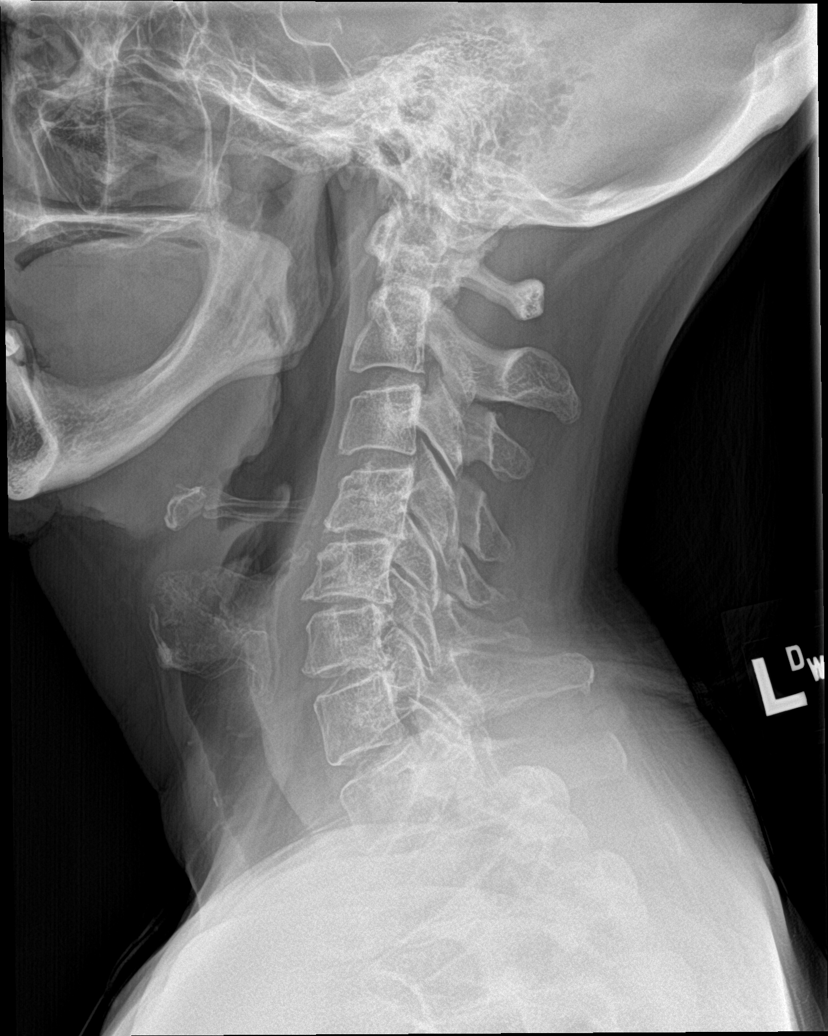

[c-spine obl (1 of 2)]
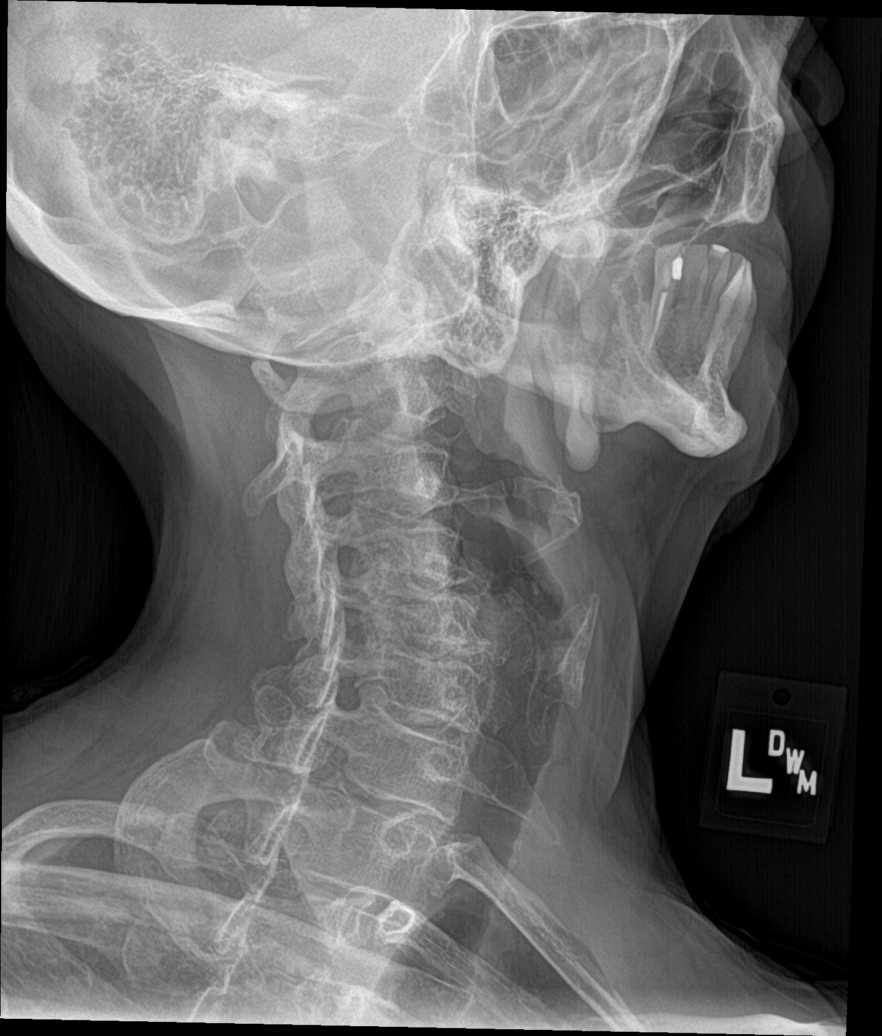

[c-spine obl (2 of 2)]
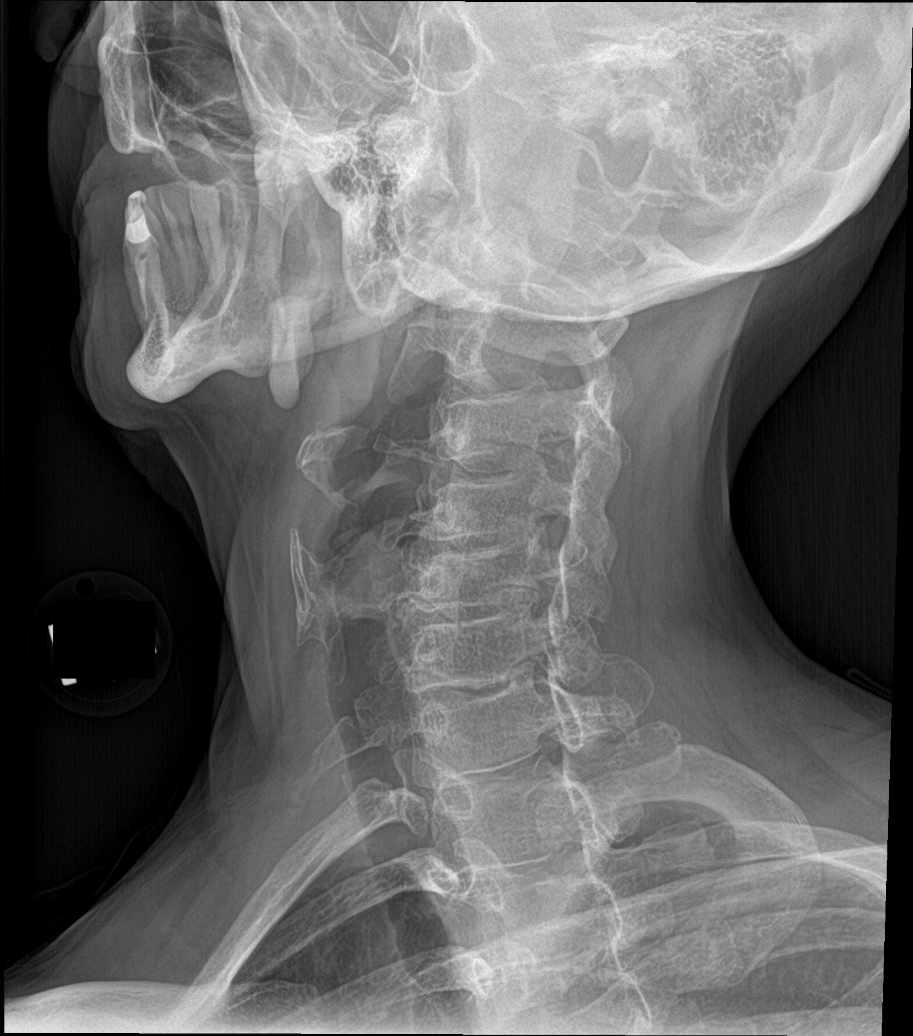

[c-spine ap]
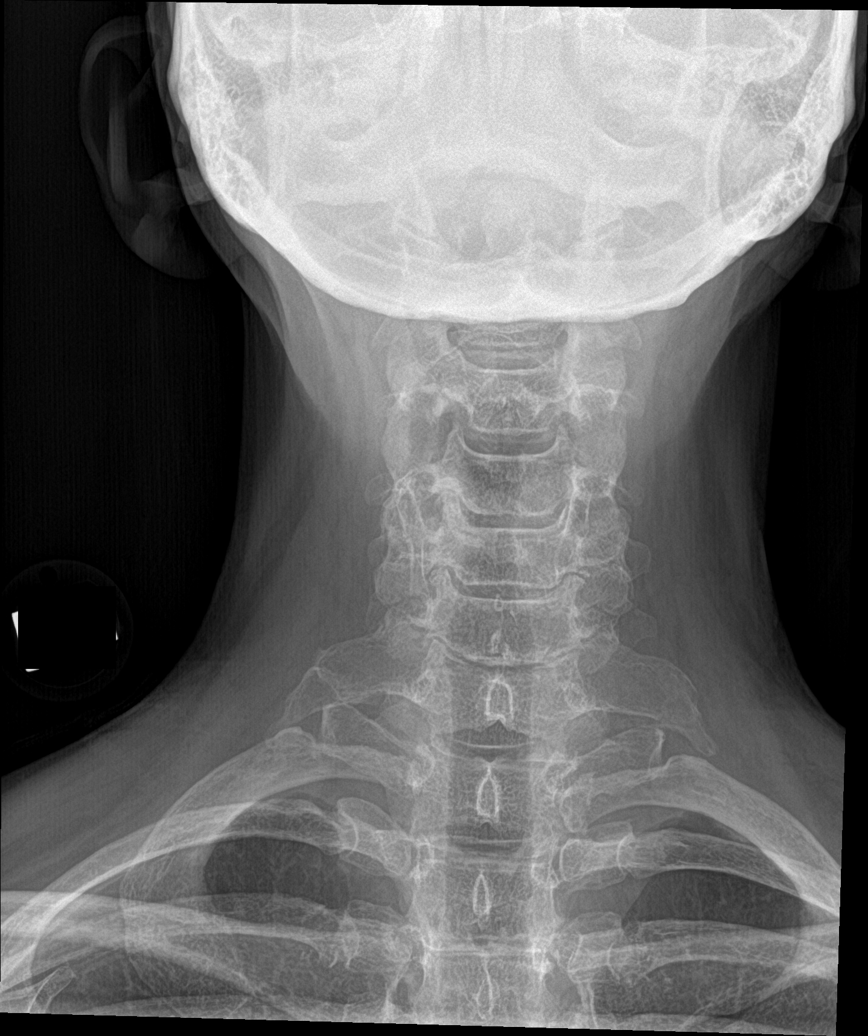

[c-spine open mouth]
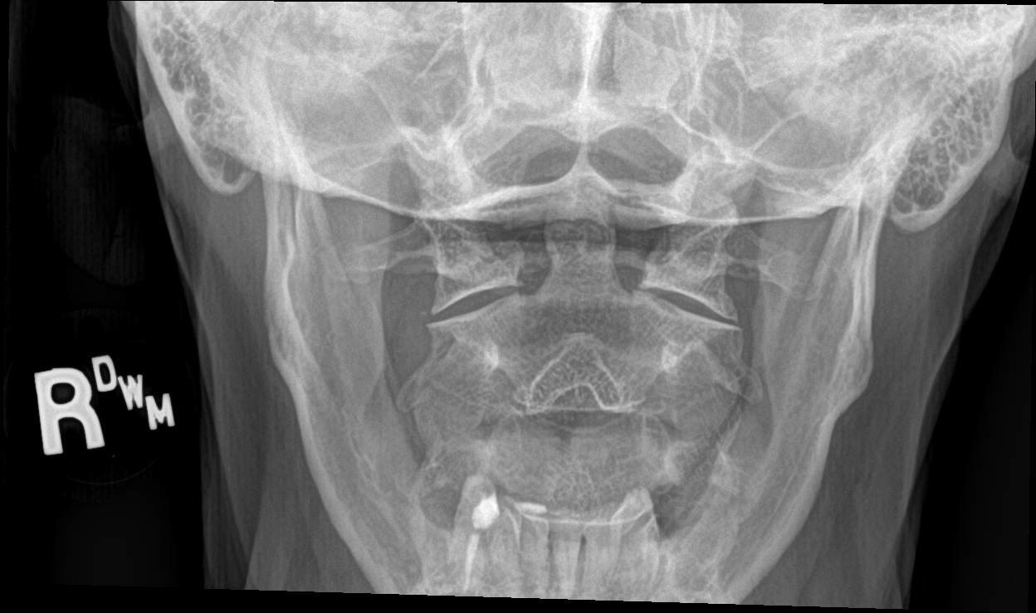

[5 of 5 positions shown; findings below may reference images not displayed]

FINDINGS: Visualization through T1 on lateral view. Multilevel degenerative
disc disease most pronounced C4-5, C5-6 and C6-7. Mild
retrolisthesis of C5 on C6. Prevertebral soft tissues are
unremarkable. Lung apices are clear. Lateral masses articulate
appropriately with the dens.
IMPRESSION: Multilevel degenerative disc disease most pronounced C4-5, C5-6 and
C6-7.

## 2018-04-24 ENCOUNTER — Emergency Department (INDEPENDENT_AMBULATORY_CARE_PROVIDER_SITE_OTHER)
Admission: EM | Admit: 2018-04-24 | Discharge: 2018-04-24 | Disposition: A | Discharge status: Home / Self Care | Source: Home / Self Care | Attending: Internal Medicine | Admitting: Internal Medicine

## 2018-04-24 ENCOUNTER — Other Ambulatory Visit: Payer: Self-pay

## 2018-04-24 ENCOUNTER — Emergency Department (INDEPENDENT_AMBULATORY_CARE_PROVIDER_SITE_OTHER)

## 2018-04-24 ENCOUNTER — Encounter: Payer: Self-pay | Admitting: *Deleted

## 2018-04-24 DIAGNOSIS — R05 Cough: Secondary | ICD-10-CM

## 2018-04-24 DIAGNOSIS — J189 Pneumonia, unspecified organism: Secondary | ICD-10-CM

## 2018-04-24 DIAGNOSIS — R059 Cough, unspecified: Secondary | ICD-10-CM

## 2018-04-24 MED ORDER — HYDROCOD POLST-CPM POLST ER 10-8 MG/5ML PO SUER: 5.0000 mL | Freq: Two times a day (BID) | ORAL | 0 refills | Status: AC | PRN

## 2018-04-24 MED ORDER — PREDNISONE 10 MG (21) PO TBPK: ORAL_TABLET | Freq: Every day | ORAL | 0 refills | Status: DC

## 2018-04-24 MED ORDER — AZITHROMYCIN 250 MG PO TABS: 250.0000 mg | ORAL_TABLET | Freq: Every day | ORAL | 0 refills | Status: DC

## 2018-04-24 MED ORDER — ALBUTEROL SULFATE HFA 108 (90 BASE) MCG/ACT IN AERS: 2.0000 | INHALATION_SPRAY | RESPIRATORY_TRACT | 0 refills | Status: DC | PRN

## 2018-04-24 NOTE — ED Triage Notes (Signed)
Pt c/o productive cough and mid back pain x 04/18/2018. She has taken Mucinex and equate brand nighttime cold and flu.
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
## 2018-04-25 ENCOUNTER — Emergency Department (INDEPENDENT_AMBULATORY_CARE_PROVIDER_SITE_OTHER)

## 2018-04-25 ENCOUNTER — Other Ambulatory Visit: Payer: Self-pay

## 2018-04-25 ENCOUNTER — Emergency Department (INDEPENDENT_AMBULATORY_CARE_PROVIDER_SITE_OTHER)
Admission: EM | Admit: 2018-04-25 | Discharge: 2018-04-25 | Disposition: A | Discharge status: Home / Self Care | Source: Home / Self Care | Attending: Family Medicine | Admitting: Family Medicine

## 2018-04-25 ENCOUNTER — Encounter: Payer: Self-pay | Admitting: *Deleted

## 2018-04-25 DIAGNOSIS — R0789 Other chest pain: Secondary | ICD-10-CM | POA: Diagnosis not present

## 2018-04-25 DIAGNOSIS — R091 Pleurisy: Secondary | ICD-10-CM

## 2018-04-25 DIAGNOSIS — R0781 Pleurodynia: Secondary | ICD-10-CM

## 2018-04-25 DIAGNOSIS — R05 Cough: Secondary | ICD-10-CM | POA: Diagnosis not present
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
## 2018-04-25 NOTE — ED Triage Notes (Signed)
Patient was seen and treated yesterday for "atypical pneumonia". Reports she had back pain yesterday, awoke today with severe pain in left ribs/abdomen. Reports too painful to cough. Right side feels improved.

## 2018-04-25 NOTE — Discharge Instructions (Signed)
°  Please continue to take all the medication prescribed to you yesterday including the prednisone, antibiotic, and cough medication at night. You may take 500mg  acetaminophen every 4-6 hours or in combination with ibuprofen 400-600mg  every 6-8 hours as needed for pain, inflammation, and fever.  Be sure to well hydrated with clear liquids and get at least 8 hours of sleep at night, preferably more while sick.   Please follow up with family medicine in 1 week if needed.

## 2018-05-13 ENCOUNTER — Encounter: Payer: Self-pay | Admitting: Sports Medicine

## 2018-05-13 ENCOUNTER — Telehealth: Admitting: Family Medicine

## 2018-05-13 DIAGNOSIS — H5789 Other specified disorders of eye and adnexa: Secondary | ICD-10-CM

## 2018-05-13 DIAGNOSIS — H01009 Unspecified blepharitis unspecified eye, unspecified eyelid: Secondary | ICD-10-CM

## 2018-05-13 NOTE — Progress Notes (Signed)
Based on what you shared with me it looks like you have a condition that should be evaluated in a face to face office visit.  NOTE: If you entered your credit card information for this eVisit, you will not be charged. You may see a "hold" on your card for the $30 but that hold will drop off and you will not have a charge processed.  If you are having a true medical emergency please call 911.  If you need an urgent face to face visit, [facility] has four urgent care centers for your convenience.  If you need care fast and have a high deductible or no insurance consider:   [email or web address] to reserve your spot online an avoid wait times  [facility] [address], [address] [place], [place] [address] 8 am to 8 pm Monday-Friday 10 am to 4 pm Saturday-Sunday *Across the street from [employer]  [facility]  [address] [place], [address] 8 am to 5 pm Monday-Friday * In the [facility]   The following sites will take your  insurance:  . [facility]  [phone] Get Driving Directions Find a Provider at this Location  [address] , [place] [address] . 10 am to 8 pm Monday-Friday . 12 pm to 8 pm Saturday-Sunday   . [facility]  [phone] Get Driving Directions Find a Provider at this Location  [address], [address] [place], [place] [address] . 8 am to 8 pm Monday-Friday . 9 am to 6 pm Saturday . 11 am to 6 pm Sunday   . [facility] [place]  [phone] Get Driving Directions  [address] [place], [place] [address] . 8 am to 8 pm Monday-Friday . 8 am to 4 pm Saturday-Sunday   Your e-visit answers were reviewed by a board certified advanced clinical practitioner to complete your personal care plan.  Thank you for using e-Visits. 

## 2018-05-14 DIAGNOSIS — H01009 Unspecified blepharitis unspecified eye, unspecified eyelid: Secondary | ICD-10-CM | POA: Insufficient documentation

## 2018-05-14 MED ORDER — ERYTHROMYCIN 2 % EX GEL: Freq: Every day | CUTANEOUS | 0 refills | Status: DC

## 2018-05-14 NOTE — Assessment & Plan Note (Signed)
Diagnosed by another provider, did well with erythromycin ointment.  Calling in some more.

## 2018-05-16 ENCOUNTER — Encounter: Payer: Self-pay | Admitting: Sports Medicine

## 2018-05-16 MED ORDER — ERYTHROMYCIN 5 MG/GM OP OINT: 1.0000 "application " | TOPICAL_OINTMENT | Freq: Three times a day (TID) | OPHTHALMIC | 0 refills | Status: AC

## 2018-06-28 ENCOUNTER — Other Ambulatory Visit: Payer: Self-pay | Admitting: Sports Medicine

## 2018-06-28 DIAGNOSIS — R140 Abdominal distension (gaseous): Secondary | ICD-10-CM

## 2018-08-19 ENCOUNTER — Encounter: Payer: Self-pay | Admitting: Sports Medicine

## 2018-08-20 NOTE — Telephone Encounter (Signed)
Thank you, I think this is very reasonable considering I have not seen her in about a year.  I am happy to do Saxenda when I see her, we might even try Ozempic which has better weight loss.

## 2018-11-28 ENCOUNTER — Other Ambulatory Visit: Payer: Self-pay | Admitting: Sports Medicine

## 2019-03-11 ENCOUNTER — Other Ambulatory Visit: Payer: Self-pay | Admitting: Sports Medicine

## 2019-03-11 ENCOUNTER — Other Ambulatory Visit (HOSPITAL_BASED_OUTPATIENT_CLINIC_OR_DEPARTMENT_OTHER): Payer: Self-pay | Admitting: Sports Medicine

## 2019-03-11 DIAGNOSIS — Z1231 Encounter for screening mammogram for malignant neoplasm of breast: Secondary | ICD-10-CM

## 2019-03-13 ENCOUNTER — Other Ambulatory Visit: Payer: Self-pay

## 2019-03-13 ENCOUNTER — Ambulatory Visit (INDEPENDENT_AMBULATORY_CARE_PROVIDER_SITE_OTHER)

## 2019-03-13 DIAGNOSIS — Z1231 Encounter for screening mammogram for malignant neoplasm of breast: Secondary | ICD-10-CM | POA: Diagnosis not present

## 2019-03-17 ENCOUNTER — Encounter (HOSPITAL_BASED_OUTPATIENT_CLINIC_OR_DEPARTMENT_OTHER): Payer: Self-pay

## 2019-03-31 ENCOUNTER — Encounter: Payer: Self-pay | Admitting: Sports Medicine

## 2019-03-31 MED ORDER — SIMVASTATIN 40 MG PO TABS: 40.0000 mg | ORAL_TABLET | Freq: Every evening | ORAL | 0 refills | Status: DC

## 2019-06-04 MED ORDER — CYCLOBENZAPRINE HCL 10 MG PO TABS: ORAL_TABLET | ORAL | 0 refills | Status: DC

## 2019-06-04 NOTE — Telephone Encounter (Signed)
Calling in some Flexeril to hold her over in the meantime.

## 2019-07-18 ENCOUNTER — Ambulatory Visit

## 2019-09-19 ENCOUNTER — Other Ambulatory Visit: Payer: Self-pay | Admitting: Sports Medicine

## 2019-09-19 DIAGNOSIS — R140 Abdominal distension (gaseous): Secondary | ICD-10-CM

## 2019-10-30 ENCOUNTER — Ambulatory Visit (INDEPENDENT_AMBULATORY_CARE_PROVIDER_SITE_OTHER)
Admission: RE | Admit: 2019-10-30 | Discharge: 2019-10-30 | Disposition: A | Discharge status: Home / Self Care | Source: Ambulatory Visit

## 2019-10-30 DIAGNOSIS — L853 Xerosis cutis: Secondary | ICD-10-CM | POA: Diagnosis not present

## 2019-10-30 NOTE — ED Provider Notes (Signed)
Virtual Visit via Video Note:  Ann [name]  initiated request for Telemedicine visit with [facility] Urgent [facility] team. I connected with Ann [name]  on 10/30/2019 at 2:44 PM  for a synchronized telemedicine visit using a video enabled HIPPA compliant telemedicine application. I verified that I am speaking with Ann [name]  using two identifiers. Mickie Bail, NP  was physically located in a [facility] Urgent care site and Ann [name] was located at a different location.   The limitations of evaluation and management by telemedicine as well as the availability of in-person appointments were discussed. Patient was informed that she  may incur a bill ( including co-pay) for this virtual visit encounter. Ann [name]  expressed understanding and gave verbal consent to proceed with virtual visit.     History of Present Illness:Ann [name]  is a 64 y.o. female presents for evaluation of 1 month history of dry flaky itching skin in scalp and face.  No lesions or drainage.  No redness.  No fever, chills, sore throat, cough, or other symptoms.  She has attempted treatment with Allegra and face lotion.     Allergies  Allergen Reactions  . Naproxen Anaphylaxis, Other (See Comments) and Swelling    Throat Red in the face within seconds   . Desyrel [Trazodone] Other (See Comments)    Nightmares and sweats     Past Medical History:  Diagnosis Date  . Alcoholism (HCC)   . Degenerative disc disease   . Hyperlipemia   . Osteoporosis      Social History   Tobacco Use  . Smoking status: Former Smoker    Packs/day: 0.50    Years: 40.00    Pack years: 20.00    Types: Cigarettes  . Smokeless tobacco: Never Used  Vaping Use  . Vaping Use: Never used  Substance Use Topics  . Alcohol use: Yes    Alcohol/week: 70.0 standard drinks    Types: 70 Cans of beer per week  . Drug use: No    ROS: as stated in HPI.  All other systems reviewed and negative.     Observations/Objective: Physical Exam  VITALS:  Patient denies fever. GENERAL: Alert, appears well and in no acute distress. HEENT: Atraumatic. Oral mucosa appears moist. NECK: Normal movements of the head and neck. CARDIOPULMONARY: No increased WOB. Speaking in clear sentences. I:E ratio WNL.  MS: Moves all visible extremities without noticeable abnormality. PSYCH: Pleasant and cooperative, well-groomed. Speech normal rate and rhythm. Affect is appropriate. Insight and judgement are appropriate. Attention is focused, linear, and appropriate.  NEURO: CN grossly intact. Oriented as arrived to appointment on time with no prompting. Moves both UE equally.  SKIN: No obvious lesions, wounds, erythema, or cyanosis noted on face or hands.   Assessment and Plan:    ICD-10-CM   1. Dry skin dermatitis  L85.3        Follow Up Instructions: Discussed limitations of video visit for dermatological issues with patient.  Discussed treatment with OTC Eucerin lotion for dry skin; instructed patient not to apply this around her eyes.  Discussed treatment with OTC shampoo for dry itchy scalp.  Instructed her to follow-up with her PCP or dermatologist if her symptoms are not improving.  Patient agrees to plan of care.      I discussed the assessment and treatment plan with the patient. The patient was provided an opportunity to ask questions and all were answered. The patient agreed with the plan and demonstrated an  understanding of the instructions.   The patient was advised to call back or seek an in-person evaluation if the symptoms worsen or if the condition fails to improve as anticipated.      Mickie Bail, NP  10/30/2019 2:44 PM         Mickie Bail, NP 10/30/19 1444

## 2019-10-30 NOTE — Discharge Instructions (Signed)
Try Eucerin lotion for your dry skin (do not use around eyes).  Try shampoo for dry itching scalp.    Follow-up with your primary care provider or a dermatologist if your symptoms are not improving.

## 2019-11-04 ENCOUNTER — Ambulatory Visit (INDEPENDENT_AMBULATORY_CARE_PROVIDER_SITE_OTHER): Admitting: Sports Medicine

## 2019-11-04 ENCOUNTER — Encounter: Payer: Self-pay | Admitting: Sports Medicine

## 2019-11-04 DIAGNOSIS — F419 Anxiety disorder, unspecified: Secondary | ICD-10-CM

## 2019-11-04 DIAGNOSIS — F329 Major depressive disorder, single episode, unspecified: Secondary | ICD-10-CM

## 2019-11-04 DIAGNOSIS — Z0000 Encounter for general adult medical examination without abnormal findings: Secondary | ICD-10-CM

## 2019-11-04 DIAGNOSIS — F32A Depression, unspecified: Secondary | ICD-10-CM | POA: Insufficient documentation

## 2019-11-04 DIAGNOSIS — E785 Hyperlipidemia, unspecified: Secondary | ICD-10-CM | POA: Diagnosis not present

## 2019-11-04 MED ORDER — SIMVASTATIN 40 MG PO TABS: 40.0000 mg | ORAL_TABLET | Freq: Every day | ORAL | 3 refills | Status: DC

## 2019-11-04 MED ORDER — ALPRAZOLAM 0.25 MG PO TABS: 0.2500 mg | ORAL_TABLET | Freq: Three times a day (TID) | ORAL | 0 refills | Status: DC | PRN

## 2019-11-04 MED ORDER — ESCITALOPRAM OXALATE 5 MG PO TABS: 5.0000 mg | ORAL_TABLET | Freq: Every day | ORAL | 3 refills | Status: DC

## 2019-11-04 MED ORDER — SHINGRIX 50 MCG/0.5ML IM SUSR: 0.5000 mL | Freq: Once | INTRAMUSCULAR | 0 refills | Status: AC

## 2019-11-04 NOTE — Assessment & Plan Note (Signed)
I am going to get Ann [name] up-to-date on her screening measures, she will come back at some point later for an annual physical. She is due for her Shingrix vaccines, colon cancer screening, cervical cancer screening, bone density screening.
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
## 2019-11-04 NOTE — Assessment & Plan Note (Signed)
Rechecking labs including lipids. 
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
## 2019-11-04 NOTE — Assessment & Plan Note (Signed)
Ann [name] is really concerned about her weight, I would like her to touch base with the healthy [facility] center.

## 2019-11-06 ENCOUNTER — Emergency Department (INDEPENDENT_AMBULATORY_CARE_PROVIDER_SITE_OTHER)
Admission: RE | Admit: 2019-11-06 | Discharge: 2019-11-06 | Disposition: A | Discharge status: Home / Self Care | Source: Ambulatory Visit | Attending: Family Medicine | Admitting: Family Medicine

## 2019-11-06 ENCOUNTER — Other Ambulatory Visit: Payer: Self-pay

## 2019-11-06 ENCOUNTER — Emergency Department (INDEPENDENT_AMBULATORY_CARE_PROVIDER_SITE_OTHER)

## 2019-11-06 VITALS — BP 158/95 | HR 92 | Temp 98.3°F | Resp 18 | Wt 184.2 lb

## 2019-11-06 DIAGNOSIS — R109 Unspecified abdominal pain: Secondary | ICD-10-CM

## 2019-11-06 DIAGNOSIS — R05 Cough: Secondary | ICD-10-CM | POA: Diagnosis not present

## 2019-11-06 DIAGNOSIS — R140 Abdominal distension (gaseous): Secondary | ICD-10-CM

## 2019-11-06 DIAGNOSIS — J9801 Acute bronchospasm: Secondary | ICD-10-CM

## 2019-11-06 DIAGNOSIS — R0602 Shortness of breath: Secondary | ICD-10-CM | POA: Diagnosis not present

## 2019-11-06 LAB — COMPLETE METABOLIC PANEL WITH GFR
AG Ratio: 1.6 (calc) (ref 1.0–2.5)
ALT: 23 U/L (ref 6–29)
AST: 25 U/L (ref 10–35)
Albumin: 4.3 g/dL (ref 3.6–5.1)
Alkaline phosphatase (APISO): 57 U/L (ref 37–153)
BUN/Creatinine Ratio: 12 (calc) (ref 6–22)
BUN: 12 mg/dL (ref 7–25)
CO2: 25 mmol/L (ref 20–32)
Calcium: 9.6 mg/dL (ref 8.6–10.4)
Chloride: 106 mmol/L (ref 98–110)
Creat: 1.02 mg/dL — ABNORMAL HIGH (ref 0.50–0.99)
GFR, Est African American: 68 mL/min/{1.73_m2} (ref >=60)
GFR, Est Non African American: 58 mL/min/{1.73_m2} — ABNORMAL LOW (ref >=60)
Globulin: 2.7 g/dL (calc) (ref 1.9–3.7)
Glucose, Bld: 95 mg/dL (ref 65–99)
Potassium: 4.1 mmol/L (ref 3.5–5.3)
Sodium: 141 mmol/L (ref 135–146)
Total Bilirubin: 0.4 mg/dL (ref 0.2–1.2)
Total Protein: 7 g/dL (ref 6.1–8.1)

## 2019-11-06 LAB — CBC
HCT: 39.5 % (ref 35.0–45.0)
Hemoglobin: 13.2 g/dL (ref 11.7–15.5)
MCH: 32.3 pg (ref 27.0–33.0)
MCHC: 33.4 g/dL (ref 32.0–36.0)
MCV: 96.6 fL (ref 80.0–100.0)
MPV: 11.5 fL (ref 7.5–12.5)
Platelets: 334 10*3/uL (ref 140–400)
RBC: 4.09 10*6/uL (ref 3.80–5.10)
RDW: 12.1 % (ref 11.0–15.0)
WBC: 6 10*3/uL (ref 3.8–10.8)

## 2019-11-06 LAB — LIPID PANEL W/REFLEX DIRECT LDL
Cholesterol: 203 mg/dL — ABNORMAL HIGH (ref <200)
HDL: 67 mg/dL (ref >=50)
LDL Cholesterol (Calc): 111 mg/dL (calc) — ABNORMAL HIGH
Non-HDL Cholesterol (Calc): 136 mg/dL (calc) — ABNORMAL HIGH (ref <130)
Total CHOL/HDL Ratio: 3 (calc) (ref <5.0)
Triglycerides: 137 mg/dL (ref <150)

## 2019-11-06 LAB — TSH: TSH: 0.62 mIU/L (ref 0.40–4.50)

## 2019-11-06 MED ORDER — PREDNISONE 20 MG PO TABS
ORAL_TABLET | ORAL | 0 refills | Status: DC
Start: 2019-11-06 — End: 2020-09-22

## 2019-11-06 NOTE — Telephone Encounter (Signed)
Appointment is going to be needed for complaints of shortness of breath.  She can see me if I have any openings or any one of the other providers in this office.

## 2019-11-06 NOTE — Discharge Instructions (Addendum)
Use albuterol inhaler as needed. Recommend taking daily Miralax, approximately 1/2 to one capful mixed in 4 to 8 ounces of water until bowel movements are regular. Recommend increasing natural fiber in diet.  May also take a daily fiber product such as Citrucel with plenty of fluid.   If symptoms become significantly worse during the night or over the weekend, proceed to the local emergency room.

## 2019-11-06 NOTE — ED Triage Notes (Signed)
Pt c/o shortness of breath since Sunday night. Worse at night. Pt says she feels bloated and like she cant take a deep breath. Also coughing up phlegm. Trying albuterol prn with little relief.

## 2019-11-06 NOTE — ED Provider Notes (Signed)
[facility]    CSN: [id number] Arrival date & time: 11/06/19  1048      History   Chief Complaint Chief Complaint  Patient presents with  . Appointment    11am  . Shortness of Breath  . Cough    HPI Ann [name] is a 64 y.o. female.   Patient complains of developing shortness of breath, worse at night, four days ago.  She complains of abdominal bloating/discomfort that interferes with her ability to take a deep breath.  She denies fevers, chills, and sweats.  She has a mild cough but denies wheezing or shortness of breath.  Her albuterol inhaler has provided no relief.  She states that she has felt somewhat constipated and has been using a stool softener for a week.  Her stools have been otherwise normal and she denies recent changes.  She notes that her bloating becomes worse after eating. Review of records reveal that routing labs done yesterday by her PCP were normal:  TSH and CBC.  CMP was normal except decreased GFR 58.   The history is provided by the patient.    Past Medical History:  Diagnosis Date  . Alcoholism (HCC)   . Degenerative disc disease   . Hyperlipemia   . Osteoporosis     Patient Active Problem List   Diagnosis Date Noted  . Anxiety and depression 11/04/2019  . Urinary urgency 08/07/2017  . Trochanteric bursitis, right hip 06/01/2016  . Primary osteoarthritis of right knee 06/01/2016  . Morbid obesity (HCC) 06/01/2016  . Restless leg syndrome 01/21/2016  . Annual physical exam 11/12/2015  . DDD (degenerative disc disease), cervical 11/12/2015  . Benign paroxysmal positional vertigo 09/22/2015  . Chronic bronchitis (HCC) 12/30/2014  . Pulmonary nodule 09/19/2013  . Hyperlipidemia 09/12/2013  . Smoker 09/12/2013  . Lumbar degenerative disc disease 08/28/2012    Past Surgical History:  Procedure Laterality Date  . CYSTOCELE REPAIR    . TUBAL LIGATION      OB History   No obstetric history on file.      Home Medications     Prior to Admission medications   Medication Sig Start Date End Date Taking? Authorizing Provider  albuterol (PROVENTIL HFA;VENTOLIN HFA) 108 (90 Base) MCG/ACT inhaler Inhale 2 puffs into the lungs every 4 (four) hours as needed for wheezing or shortness of breath. 04/24/18   Arnaldo Natal, MD  ALPRAZolam Prudy Feeler) 0.25 MG tablet Take 1 tablet (0.25 mg total) by mouth 3 (three) times daily as needed for anxiety. 11/04/19   Monica Becton, MD  CHANTIX 1 MG tablet TAKE 1 TABLET TWICE A DAY 09/19/19   Monica Becton, MD  escitalopram (LEXAPRO) 5 MG tablet Take 1 tablet (5 mg total) by mouth daily. 11/04/19   Monica Becton, MD  Multiple Vitamin (MULTIVITAMIN WITH MINERALS) TABS tablet Take 1 tablet by mouth daily. 12/14/14   Thermon Leyland, NP  pantoprazole (PROTONIX) 40 MG tablet TAKE 1 TABLET TWICE A DAY 09/19/19   Monica Becton, MD  predniSONE (DELTASONE) 20 MG tablet Take one tab by mouth twice daily for 4 days, then one daily for 3 days. Take with food. 11/06/19   Lattie Haw, MD  simvastatin (ZOCOR) 40 MG tablet Take 1 tablet (40 mg total) by mouth daily at 6 PM. 11/04/19   Monica Becton, MD    Family History Family History  Problem Relation Age of Onset  . Cancer Mother  breast  . Alcohol abuse Mother   . Cancer Father        prostate  . Alcohol abuse Paternal Uncle   . Alcohol abuse Maternal Grandfather   . Alcohol abuse Paternal Grandfather     Social History Social History   Tobacco Use  . Smoking status: Former Smoker    Packs/day: 0.50    Years: 40.00    Pack years: 20.00    Types: Cigarettes  . Smokeless tobacco: Never Used  Vaping Use  . Vaping Use: Never used  Substance Use Topics  . Alcohol use: Yes    Alcohol/week: 70.0 standard drinks    Types: 70 Cans of beer per week  . Drug use: No     Allergies   Naproxen and Desyrel [trazodone]   Review of Systems Review of Systems  Constitutional: Positive for  activity change. Negative for appetite change, chills, diaphoresis, fatigue, fever and unexpected weight change.  HENT: Negative.   Eyes: Negative.   Respiratory: Positive for cough and shortness of breath. Negative for chest tightness, wheezing and stridor.   Cardiovascular: Negative for chest pain and palpitations.  Gastrointestinal: Positive for abdominal distention, abdominal pain and constipation. Negative for blood in stool, diarrhea, nausea and vomiting.  Genitourinary: Negative.   Musculoskeletal: Negative.   Skin: Negative.   Neurological: Negative.   Hematological: Negative.      Physical Exam Triage Vital Signs ED Triage Vitals  Enc Vitals Group     BP 11/06/19 1055 (!) 158/95     Pulse Rate 11/06/19 1055 92     Resp 11/06/19 1055 18     Temp 11/06/19 1055 98.3 F (36.8 C)     Temp Source 11/06/19 1055 Oral     SpO2 11/06/19 1055 93 %     Weight 11/06/19 1105 184 lb 3.2 oz (83.6 kg)     Height --      Head Circumference --      Peak Flow --      Pain Score 11/06/19 1058 0     Pain Loc --      Pain Edu? --      Excl. in GC? --    No data found.  Updated Vital Signs BP (!) 158/95 (BP Location: Right Arm)   Pulse 92   Temp 98.3 F (36.8 C) (Oral)   Resp 18   Wt 83.6 kg   SpO2 93%   BMI 32.63 kg/m   Visual Acuity Right Eye Distance:   Left Eye Distance:   Bilateral Distance:    Right Eye Near:   Left Eye Near:    Bilateral Near:     Physical Exam Vitals and nursing note reviewed.  Constitutional:      General: She is not in acute distress. HENT:     Head: Normocephalic.     Right Ear: Tympanic membrane normal.     Left Ear: Tympanic membrane normal.     Nose: Nose normal.     Mouth/Throat:     Mouth: Mucous membranes are moist.     Pharynx: Oropharynx is clear.  Eyes:     Conjunctiva/sclera: Conjunctivae normal.     Pupils: Pupils are equal, round, and reactive to light.  Cardiovascular:     Rate and Rhythm: Normal rate and regular  rhythm.     Heart sounds: Normal heart sounds.  Pulmonary:     Breath sounds: Normal breath sounds.  Abdominal:     General: Abdomen is protuberant. Bowel  sounds are increased.     Palpations: Abdomen is soft. There is no hepatomegaly or splenomegaly.     Tenderness: There is generalized abdominal tenderness. There is no right CVA tenderness, left CVA tenderness or guarding.    Musculoskeletal:        General: No tenderness.     Cervical back: Neck supple.     Right lower leg: No edema.     Left lower leg: No edema.  Lymphadenopathy:     Cervical: No cervical adenopathy.  Skin:    General: Skin is warm and dry.     Findings: No rash.  Neurological:     Mental Status: She is alert and oriented to person, place, and time.      UC Treatments / Results  Labs (all labs ordered are listed, but only abnormal results are displayed) Labs Reviewed - No data to display  EKG   Radiology DG Chest 2 View  Result Date: 11/06/2019 CLINICAL DATA:  Cough and shortness of breath EXAM: CHEST - 2 VIEW COMPARISON:  April 25, 2018 FINDINGS: Lungs are clear. Heart size and pulmonary vascularity are normal. No adenopathy. There is aortic atherosclerosis. No bone lesions. IMPRESSION: Lungs clear.  Cardiac silhouette normal. Aortic Atherosclerosis (ICD10-I70.0). Electronically Signed   By: Bretta Bang III M.D.   On: 11/06/2019 11:22   DG Abd 2 Views  Result Date: 11/06/2019 CLINICAL DATA:  Acute abdominal pain and bloating.  Constipation. EXAM: ABDOMEN - 2 VIEW COMPARISON:  None. FINDINGS: No abnormal bowel dilatation is noted. Moderate amount of stool is seen in the descending colon. There is no evidence of free air. No radio-opaque calculi or other significant radiographic abnormality is seen. IMPRESSION: Moderate stool burden. No evidence of bowel obstruction or ileus. Electronically Signed   By: Lupita Raider M.D.   On: 11/06/2019 12:11    Procedures Procedures (including critical care  time)  Medications Ordered in UC Medications - No data to display  Initial Impression / Assessment and Plan / UC Course  I have reviewed the triage vital signs and the nursing notes.  Pertinent labs & imaging results that were available during my care of the patient were reviewed by me and considered in my medical decision making (see chart for details).    Normal CBC and TSH done yesterday reassuring. ?early viral URI causing bronchospasm.  Begin prednisone burst/taper. Note moderate stool burden on DG abdomen (2 view). Suspect constipation as source of patient's abdominal bloating and discomfort. Followup with Family Doctor if not improving.   Final Clinical Impressions(s) / UC Diagnoses   Final diagnoses:  Abdominal pain in female patient  Bronchospasm, acute  Abdominal bloating     Discharge Instructions     Use albuterol inhaler as needed. Recommend taking daily Miralax, approximately 1/2 to one capful mixed in 4 to 8 ounces of water until bowel movements are regular. Recommend increasing natural fiber in diet.  May also take a daily fiber product such as Citrucel with plenty of fluid.   If symptoms become significantly worse during the night or over the weekend, proceed to the local emergency room.     ED Prescriptions    Medication Sig Dispense Auth. Provider   predniSONE (DELTASONE) 20 MG tablet Take one tab by mouth twice daily for 4 days, then one daily for 3 days. Take with food. 11 tablet Lattie Haw, MD        Lattie Haw, MD 11/08/19 [phone]

## 2019-11-12 ENCOUNTER — Encounter (INDEPENDENT_AMBULATORY_CARE_PROVIDER_SITE_OTHER): Payer: Self-pay

## 2019-12-09 ENCOUNTER — Ambulatory Visit: Admitting: Sports Medicine

## 2020-02-19 ENCOUNTER — Other Ambulatory Visit: Payer: Self-pay | Admitting: Sports Medicine

## 2020-02-19 DIAGNOSIS — F32A Depression, unspecified: Secondary | ICD-10-CM

## 2020-02-19 DIAGNOSIS — F419 Anxiety disorder, unspecified: Secondary | ICD-10-CM

## 2020-02-29 ENCOUNTER — Telehealth: Payer: Self-pay | Admitting: Emergency Medicine

## 2020-02-29 ENCOUNTER — Ambulatory Visit: Payer: Self-pay

## 2020-02-29 NOTE — Telephone Encounter (Signed)
Call to [facility] regarding appointment for later on today. Schedule stated it was "impossible to walk after falling off a ladder while hanging curtains" Due to potential for femur fracture or pelvic fracture, provider (Dr Cathren Harsh) requested patient be evaluated in the Emergency Dept. Pt called back to confirm she was going to ED

## 2020-03-29 ENCOUNTER — Other Ambulatory Visit: Payer: Self-pay | Admitting: Sports Medicine

## 2020-03-29 DIAGNOSIS — F32A Depression, unspecified: Secondary | ICD-10-CM

## 2020-03-29 DIAGNOSIS — F419 Anxiety disorder, unspecified: Secondary | ICD-10-CM

## 2020-04-28 ENCOUNTER — Other Ambulatory Visit: Payer: Self-pay | Admitting: Sports Medicine

## 2020-04-28 DIAGNOSIS — F32A Depression, unspecified: Secondary | ICD-10-CM

## 2020-05-11 ENCOUNTER — Other Ambulatory Visit: Payer: Self-pay | Admitting: Sports Medicine

## 2020-05-11 DIAGNOSIS — E785 Hyperlipidemia, unspecified: Secondary | ICD-10-CM

## 2020-05-11 MED ORDER — SIMVASTATIN 40 MG PO TABS: 40.0000 mg | ORAL_TABLET | Freq: Every day | ORAL | 3 refills | Status: DC

## 2020-06-02 ENCOUNTER — Other Ambulatory Visit: Payer: Self-pay | Admitting: Sports Medicine

## 2020-06-02 DIAGNOSIS — F419 Anxiety disorder, unspecified: Secondary | ICD-10-CM

## 2020-06-02 DIAGNOSIS — F32A Depression, unspecified: Secondary | ICD-10-CM

## 2020-06-28 ENCOUNTER — Other Ambulatory Visit: Payer: Self-pay | Admitting: Sports Medicine

## 2020-06-28 DIAGNOSIS — F32A Depression, unspecified: Secondary | ICD-10-CM

## 2020-06-28 DIAGNOSIS — F419 Anxiety disorder, unspecified: Secondary | ICD-10-CM

## 2020-07-20 ENCOUNTER — Other Ambulatory Visit: Payer: Self-pay | Admitting: Sports Medicine

## 2020-07-20 DIAGNOSIS — Z1231 Encounter for screening mammogram for malignant neoplasm of breast: Secondary | ICD-10-CM

## 2020-07-21 ENCOUNTER — Ambulatory Visit (INDEPENDENT_AMBULATORY_CARE_PROVIDER_SITE_OTHER)

## 2020-07-21 ENCOUNTER — Other Ambulatory Visit: Payer: Self-pay

## 2020-07-21 DIAGNOSIS — Z1231 Encounter for screening mammogram for malignant neoplasm of breast: Secondary | ICD-10-CM

## 2020-07-27 ENCOUNTER — Telehealth: Payer: Self-pay | Admitting: Sports Medicine

## 2020-07-27 DIAGNOSIS — R1013 Epigastric pain: Secondary | ICD-10-CM | POA: Insufficient documentation

## 2020-07-27 MED ORDER — AMOXICILL-CLARITHRO-LANSOPRAZ PO MISC: Freq: Two times a day (BID) | ORAL | 0 refills | Status: DC

## 2020-07-27 MED ORDER — PANTOPRAZOLE SODIUM 40 MG PO TBEC: 40.0000 mg | DELAYED_RELEASE_TABLET | Freq: Every day | ORAL | 3 refills | Status: DC

## 2020-07-27 NOTE — Assessment & Plan Note (Signed)
A pleasant 65 year old female, she is having dyspepsia, the report and receiving is from her husband. He did have positive H. pylori test that responded well to Prevpac. She started to have similar symptoms so my concern is H. pylori in her as well, adding Prevpac followed by pantoprazole 40 mg daily, she needs to come see me within the next 6 weeks or so.

## 2020-07-27 NOTE — Telephone Encounter (Signed)
Dyspepsia A pleasant 65 year old female, she is having dyspepsia, the report and receiving is from her husband. He did have positive H. pylori test that responded well to Prevpac. She started to have similar symptoms so my concern is H. pylori in her as well, adding Prevpac followed by pantoprazole 40 mg daily, she needs to come see me within the next 6 weeks or so.

## 2020-08-01 ENCOUNTER — Other Ambulatory Visit: Payer: Self-pay | Admitting: Sports Medicine

## 2020-08-01 DIAGNOSIS — F32A Depression, unspecified: Secondary | ICD-10-CM

## 2020-08-11 ENCOUNTER — Other Ambulatory Visit: Payer: Self-pay

## 2020-08-11 ENCOUNTER — Ambulatory Visit (INDEPENDENT_AMBULATORY_CARE_PROVIDER_SITE_OTHER)

## 2020-08-11 DIAGNOSIS — Z0000 Encounter for general adult medical examination without abnormal findings: Secondary | ICD-10-CM | POA: Diagnosis not present

## 2020-08-24 ENCOUNTER — Other Ambulatory Visit: Payer: Self-pay | Admitting: Sports Medicine

## 2020-08-24 DIAGNOSIS — F419 Anxiety disorder, unspecified: Secondary | ICD-10-CM

## 2020-08-24 DIAGNOSIS — F32A Depression, unspecified: Secondary | ICD-10-CM

## 2020-08-24 MED ORDER — ESCITALOPRAM OXALATE 5 MG PO TABS: 5.0000 mg | ORAL_TABLET | Freq: Every day | ORAL | 3 refills | Status: DC

## 2020-09-22 ENCOUNTER — Other Ambulatory Visit: Payer: Self-pay

## 2020-09-22 ENCOUNTER — Encounter: Payer: Self-pay | Admitting: Sports Medicine

## 2020-09-22 ENCOUNTER — Ambulatory Visit (INDEPENDENT_AMBULATORY_CARE_PROVIDER_SITE_OTHER): Admitting: Sports Medicine

## 2020-09-22 VITALS — BP 119/69 | HR 74 | Ht 63.0 in | Wt 181.0 lb

## 2020-09-22 DIAGNOSIS — Z0000 Encounter for general adult medical examination without abnormal findings: Secondary | ICD-10-CM | POA: Diagnosis not present

## 2020-09-22 DIAGNOSIS — M5136 Other intervertebral disc degeneration, lumbar region: Secondary | ICD-10-CM | POA: Diagnosis not present

## 2020-09-22 DIAGNOSIS — L219 Seborrheic dermatitis, unspecified: Secondary | ICD-10-CM | POA: Diagnosis not present

## 2020-09-22 DIAGNOSIS — Z87891 Personal history of nicotine dependence: Secondary | ICD-10-CM | POA: Diagnosis not present

## 2020-09-22 DIAGNOSIS — M51369 Other intervertebral disc degeneration, lumbar region without mention of lumbar back pain or lower extremity pain: Secondary | ICD-10-CM

## 2020-09-22 DIAGNOSIS — E785 Hyperlipidemia, unspecified: Secondary | ICD-10-CM

## 2020-09-22 DIAGNOSIS — Z23 Encounter for immunization: Secondary | ICD-10-CM

## 2020-09-22 MED ORDER — TRIAMCINOLONE ACETONIDE 0.05 % EX OINT
TOPICAL_OINTMENT | CUTANEOUS | 11 refills | Status: DC
Start: 2020-09-22 — End: 2021-01-19

## 2020-09-22 MED ORDER — CLOBETASOL PROPIONATE 0.05 % EX OINT: 1.0000 "application " | TOPICAL_OINTMENT | Freq: Two times a day (BID) | CUTANEOUS | 11 refills | Status: DC

## 2020-09-22 NOTE — Assessment & Plan Note (Signed)
Annual physical as above, routine labs ordered.  She is due for Shingrix #2, this will be given today, she desires that we hold off on ordering Cologuard until she comes back from a month-long trip.

## 2020-09-22 NOTE — Progress Notes (Signed)
Subjective:    CC: Annual Physical Exam  HPI:  This patient is here for their annual physical  I reviewed the past medical history, family history, social history, surgical history, and allergies today and no changes were needed.  Please see the problem list section below in epic for further details.  Past Medical History: Past Medical History:  Diagnosis Date  . Alcoholism (HCC)   . Degenerative disc disease   . Hyperlipemia   . Osteoporosis    Past Surgical History: Past Surgical History:  Procedure Laterality Date  . CYSTOCELE REPAIR    . TUBAL LIGATION     Social History: Social History   Socioeconomic History  . Marital status: Married    Spouse name: Not on file  . Number of children: Not on file  . Years of education: Not on file  . Highest education level: Not on file  Occupational History  . Not on file  Tobacco Use  . Smoking status: Former Smoker    Packs/day: 0.50    Years: 40.00    Pack years: 20.00    Types: Cigarettes  . Smokeless tobacco: Never Used  Vaping Use  . Vaping Use: Never used  Substance and Sexual Activity  . Alcohol use: Yes    Alcohol/week: 70.0 standard drinks    Types: 70 Cans of beer per week  . Drug use: No  . Sexual activity: Not on file  Other Topics Concern  . Not on file  Social History Narrative  . Not on file   Social Determinants of Health   Financial Resource Strain: Not on file  Food Insecurity: Not on file  Transportation Needs: Not on file  Physical Activity: Not on file  Stress: Not on file  Social Connections: Not on file   Family History: Family History  Problem Relation Age of Onset  . Cancer Mother        breast  . Alcohol abuse Mother   . Cancer Father        prostate  . Alcohol abuse Paternal Uncle   . Alcohol abuse Maternal Grandfather   . Alcohol abuse Paternal Grandfather    Allergies: Allergies  Allergen Reactions  . Naproxen Anaphylaxis, Other (See Comments) and Swelling     Throat Red in the face within seconds   . Desyrel [Trazodone] Other (See Comments)    Nightmares and sweats   Medications: See med rec.  Review of Systems: No headache, visual changes, nausea, vomiting, diarrhea, constipation, dizziness, abdominal pain, skin rash, fevers, chills, night sweats, swollen lymph nodes, weight loss, chest pain, body aches, joint swelling, muscle aches, shortness of breath, mood changes, visual or auditory hallucinations.  Objective:    General: Well Developed, well nourished, and in no acute distress.  Neuro: Alert and oriented x3, extra-ocular muscles intact, sensation grossly intact. Cranial nerves II through XII are intact, motor, sensory, and coordinative functions are all intact. HEENT: Normocephalic, atraumatic, pupils equal round reactive to light, neck supple, no masses, no lymphadenopathy, thyroid nonpalpable. Oropharynx, nasopharynx, external ear canals are unremarkable. Skin: Warm and dry, classic seborrheic dermatitis on face, eczematous patches on hands. Cardiac: Regular rate and rhythm, no murmurs rubs or gallops.  Respiratory: Clear to auscultation bilaterally. Not using accessory muscles, speaking in full sentences.  Abdominal: Soft, nontender, nondistended, positive bowel sounds, no masses, no organomegaly.  Musculoskeletal: Shoulder, elbow, wrist, hip, knee, ankle stable, and with full range of motion.  Impression and Recommendations:    The patient was counselled,  risk factors were discussed, anticipatory guidance given.  Annual physical exam Annual physical as above, routine labs ordered.  She is due for Shingrix #2, this will be given today, she desires that we hold off on ordering Cologuard until she comes back from a month-long trip.   Former smoker Currently on Chantix, quit smoking, congratulated.  Lumbar degenerative disc disease Had a flare of back pain, known DDD, adding herniated disc rehab exercises.  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 This is a chronic disease with exacerbation and pharmacologic management.  Morbid obesity (HCC) I would like her to work on a 5 pound weight loss through dietary changes and lifestyle changes and cutting back carbs over the next month and if she is successful we will consider the addition of phentermine.  Hyperlipidemia Checking routine labs.   ___________________________________________ Ihor Austin. Benjamin Stain, M.D., ABFM., CAQSM. Primary Care and Sports Medicine [facility]  Adjunct Professor of Family Medicine  [employer]

## 2020-09-22 NOTE — Assessment & Plan Note (Signed)
I would like her to work on a 5 pound weight loss through dietary changes and lifestyle changes and cutting back carbs over the next month and if she is successful we will consider the addition of phentermine.

## 2020-09-22 NOTE — Assessment & Plan Note (Signed)
Checking routine labs 

## 2020-09-22 NOTE — Assessment & Plan Note (Addendum)
Adding low potency triamcinolone for face, clobetasol for the hands. This is a chronic disease with exacerbation and pharmacologic management.

## 2020-09-22 NOTE — Assessment & Plan Note (Signed)
Had a flare of back pain, known DDD, adding herniated disc rehab exercises.

## 2020-09-22 NOTE — Assessment & Plan Note (Signed)
Currently on Chantix, quit smoking, congratulated.

## 2020-09-23 ENCOUNTER — Encounter (INDEPENDENT_AMBULATORY_CARE_PROVIDER_SITE_OTHER)

## 2020-09-23 DIAGNOSIS — E785 Hyperlipidemia, unspecified: Secondary | ICD-10-CM | POA: Diagnosis not present

## 2020-09-23 LAB — CBC
HCT: 38.2 % (ref 35.0–45.0)
Hemoglobin: 12.3 g/dL (ref 11.7–15.5)
MCH: 30.3 pg (ref 27.0–33.0)
MCHC: 32.2 g/dL (ref 32.0–36.0)
MCV: 94.1 fL (ref 80.0–100.0)
MPV: 10.7 fL (ref 7.5–12.5)
Platelets: 281 10*3/uL (ref 140–400)
RBC: 4.06 10*6/uL (ref 3.80–5.10)
RDW: 12.5 % (ref 11.0–15.0)
WBC: 5.9 10*3/uL (ref 3.8–10.8)

## 2020-09-23 LAB — LIPID PANEL
Cholesterol: 235 mg/dL — ABNORMAL HIGH (ref <200)
HDL: 66 mg/dL (ref >=50)
LDL Cholesterol (Calc): 135 mg/dL (calc) — ABNORMAL HIGH
Non-HDL Cholesterol (Calc): 169 mg/dL (calc) — ABNORMAL HIGH (ref <130)
Total CHOL/HDL Ratio: 3.6 (calc) (ref <5.0)
Triglycerides: 200 mg/dL — ABNORMAL HIGH (ref <150)

## 2020-09-23 LAB — COMPREHENSIVE METABOLIC PANEL
AG Ratio: 1.6 (calc) (ref 1.0–2.5)
ALT: 18 U/L (ref 6–29)
AST: 23 U/L (ref 10–35)
Albumin: 4.2 g/dL (ref 3.6–5.1)
Alkaline phosphatase (APISO): 52 U/L (ref 37–153)
BUN/Creatinine Ratio: 8 (calc) (ref 6–22)
BUN: 9 mg/dL (ref 7–25)
CO2: 23 mmol/L (ref 20–32)
Calcium: 9.4 mg/dL (ref 8.6–10.4)
Chloride: 107 mmol/L (ref 98–110)
Creat: 1.13 mg/dL — ABNORMAL HIGH (ref 0.50–0.99)
Globulin: 2.7 g/dL (calc) (ref 1.9–3.7)
Glucose, Bld: 106 mg/dL — ABNORMAL HIGH (ref 65–99)
Potassium: 4.3 mmol/L (ref 3.5–5.3)
Sodium: 140 mmol/L (ref 135–146)
Total Bilirubin: 0.3 mg/dL (ref 0.2–1.2)
Total Protein: 6.9 g/dL (ref 6.1–8.1)

## 2020-09-23 LAB — TSH: TSH: 2.74 mIU/L (ref 0.40–4.50)

## 2020-09-24 DIAGNOSIS — E785 Hyperlipidemia, unspecified: Secondary | ICD-10-CM

## 2020-09-24 MED ORDER — ROSUVASTATIN CALCIUM 40 MG PO TABS: 40.0000 mg | ORAL_TABLET | Freq: Every day | ORAL | 3 refills | Status: DC

## 2020-09-24 NOTE — Assessment & Plan Note (Signed)
Lipids elevated, compliant with simvastatin per patient's report, switching to atorvastatin 40, recheck lipids in 3 months.

## 2020-09-24 NOTE — Telephone Encounter (Signed)
I spent 5 total minutes of online digital evaluation and management services. 

## 2020-09-27 MED ORDER — ROSUVASTATIN CALCIUM 40 MG PO TABS: 40.0000 mg | ORAL_TABLET | Freq: Every day | ORAL | 3 refills | Status: DC

## 2020-11-19 ENCOUNTER — Ambulatory Visit (INDEPENDENT_AMBULATORY_CARE_PROVIDER_SITE_OTHER): Payer: Medicare Other | Admitting: Sports Medicine

## 2020-11-19 ENCOUNTER — Other Ambulatory Visit: Payer: Self-pay

## 2020-11-19 VITALS — BP 115/80 | HR 86 | Ht 63.0 in | Wt 177.4 lb

## 2020-11-19 DIAGNOSIS — Z1211 Encounter for screening for malignant neoplasm of colon: Secondary | ICD-10-CM

## 2020-11-19 DIAGNOSIS — Z0000 Encounter for general adult medical examination without abnormal findings: Secondary | ICD-10-CM

## 2020-11-19 MED ORDER — PHENTERMINE HCL 37.5 MG PO TABS
ORAL_TABLET | ORAL | 0 refills | Status: DC
Start: 2020-11-19 — End: 2020-12-17

## 2020-11-19 NOTE — Assessment & Plan Note (Signed)
Due for colon cancer screening, adding Cologuard. 

## 2020-11-19 NOTE — Progress Notes (Addendum)
    Procedures performed today:    None.  Independent interpretation of notes and tests performed by another provider:   None.  Brief History, Exam, Impression, and Recommendations:    Morbid obesity (HCC) This is a very pleasant 65 year old female, she has been trying some lifestyle changes, cutting back carbs, and she is interested in furthering her weight loss journey. She has noted increasing pain in her knees, known osteoarthritis, likely exacerbated by her weight. I am agreeable to start phentermine, we will do monthly weight checks and refills. Advised of potential adverse effects, if she plateaus we can add Topamax. We also discussed an exercise prescription, she will do 30 minutes 5 times a week on a stationary bike.  Annual physical exam Due for colon cancer screening, adding Cologuard.    ___________________________________________ Ihor Austin. Benjamin Stain, M.D., ABFM., CAQSM. Primary Care and Sports Medicine [facility]  Adjunct Instructor of Family Medicine  [employer]

## 2020-11-19 NOTE — Assessment & Plan Note (Signed)
This is a very pleasant 65 year old female, she has been trying some lifestyle changes, cutting back carbs, and she is interested in furthering her weight loss journey. She has noted increasing pain in her knees, known osteoarthritis, likely exacerbated by her weight. I am agreeable to start phentermine, we will do monthly weight checks and refills. Advised of potential adverse effects, if she plateaus we can add Topamax. We also discussed an exercise prescription, she will do 30 minutes 5 times a week on a stationary bike.

## 2020-11-19 NOTE — Addendum Note (Signed)
Addended by: Monica Becton on: 11/19/2020 11:11 AM   Modules accepted: Orders

## 2020-11-29 ENCOUNTER — Other Ambulatory Visit: Payer: Self-pay | Admitting: Sports Medicine

## 2020-11-29 DIAGNOSIS — R1013 Epigastric pain: Secondary | ICD-10-CM

## 2020-11-30 ENCOUNTER — Other Ambulatory Visit: Payer: Self-pay | Admitting: Sports Medicine

## 2020-11-30 DIAGNOSIS — E785 Hyperlipidemia, unspecified: Secondary | ICD-10-CM

## 2020-11-30 MED ORDER — ROSUVASTATIN CALCIUM 40 MG PO TABS: 40.0000 mg | ORAL_TABLET | Freq: Every day | ORAL | 3 refills | Status: DC

## 2020-12-01 ENCOUNTER — Telehealth: Payer: Self-pay | Admitting: Sports Medicine

## 2020-12-01 NOTE — Telephone Encounter (Signed)
I saw Denesha's husband today, he told me she is still having acid reflux and she would like to make an appointment on the nurse visit scheduled for H. pylori testing.  She understands that she will need to stop her PPI 2 weeks prior.

## 2020-12-17 ENCOUNTER — Encounter: Payer: Self-pay | Admitting: Sports Medicine

## 2020-12-17 ENCOUNTER — Other Ambulatory Visit: Payer: Self-pay

## 2020-12-17 ENCOUNTER — Ambulatory Visit (INDEPENDENT_AMBULATORY_CARE_PROVIDER_SITE_OTHER): Payer: Medicare Other | Admitting: Sports Medicine

## 2020-12-17 MED ORDER — PHENTERMINE HCL 37.5 MG PO TABS: ORAL_TABLET | ORAL | 0 refills | Status: DC

## 2020-12-17 MED ORDER — TOPIRAMATE 50 MG PO TABS: ORAL_TABLET | ORAL | 0 refills | Status: DC

## 2020-12-17 NOTE — Assessment & Plan Note (Signed)
Minimal weight loss after the first month, refilling phentermine, adding Topamax. Return to see me in a month.  Entering the second month.

## 2020-12-17 NOTE — Progress Notes (Signed)
    Procedures performed today:    None.  Independent interpretation of notes and tests performed by another provider:   None.  Brief History, Exam, Impression, and Recommendations:    Morbid obesity (HCC) Minimal weight loss after the first month, refilling phentermine, adding Topamax. Return to see me in a month.  Entering the second month.    ___________________________________________ Ihor Austin. Benjamin Stain, M.D., ABFM., CAQSM. Primary Care and Sports Medicine [facility]  Adjunct Instructor of Family Medicine  [employer]

## 2020-12-21 LAB — COLOGUARD: Cologuard: NEGATIVE

## 2021-01-14 ENCOUNTER — Other Ambulatory Visit: Payer: Self-pay | Admitting: *Deleted

## 2021-01-14 ENCOUNTER — Ambulatory Visit (INDEPENDENT_AMBULATORY_CARE_PROVIDER_SITE_OTHER): Payer: Medicare Other | Admitting: Sports Medicine

## 2021-01-14 ENCOUNTER — Other Ambulatory Visit: Payer: Self-pay

## 2021-01-14 VITALS — BP 127/84 | HR 91 | Ht 63.0 in | Wt 173.7 lb

## 2021-01-14 DIAGNOSIS — E8881 Metabolic syndrome: Secondary | ICD-10-CM | POA: Diagnosis not present

## 2021-01-14 DIAGNOSIS — E669 Obesity, unspecified: Secondary | ICD-10-CM

## 2021-01-14 DIAGNOSIS — R739 Hyperglycemia, unspecified: Secondary | ICD-10-CM | POA: Diagnosis not present

## 2021-01-14 DIAGNOSIS — E785 Hyperlipidemia, unspecified: Secondary | ICD-10-CM | POA: Diagnosis not present

## 2021-01-14 MED ORDER — PHENDIMETRAZINE TARTRATE 35 MG PO TABS: ORAL_TABLET | ORAL | 0 refills | Status: DC

## 2021-01-14 MED ORDER — TRULICITY 0.75 MG/0.5ML ~~LOC~~ SOAJ: SUBCUTANEOUS | 11 refills | Status: DC

## 2021-01-14 NOTE — Progress Notes (Signed)
    Procedures performed today:    None.  Independent interpretation of notes and tests performed by another provider:   None.  Brief History, Exam, Impression, and Recommendations:    Abdominal obesity and metabolic syndrome Unfortunately after 2 months Ann [name] has not really lost any additional weight, she did stop Topamax along the way. She would like to try Phendimetrazine, this is appropriate albeit typically less effective than phentermine, we will also add Trulicity (tier 1) due to her likely diagnosis of metabolic syndrome. If insufficient weight loss and/or unable to get Trulicity we will go back to phentermine and try a dose sometime in the late afternoon as she tends to overeat during dinner rather than breakfast or lunch. We will also dose titrate Trulicity over the next few months as we go.    ___________________________________________ Ihor Austin. Benjamin Stain, M.D., ABFM., CAQSM. Primary Care and Sports Medicine [facility]  Adjunct Instructor of Family Medicine  [employer]

## 2021-01-14 NOTE — Assessment & Plan Note (Signed)
Unfortunately after 2 months Ann [name] has not really lost any additional weight, she did stop Topamax along the way. She would like to try Phendimetrazine, this is appropriate albeit typically less effective than phentermine, we will also add Trulicity (tier 1) due to her likely diagnosis of metabolic syndrome. If insufficient weight loss and/or unable to get Trulicity we will go back to phentermine and try a dose sometime in the late afternoon as she tends to overeat during dinner rather than breakfast or lunch. We will also dose titrate Trulicity over the next few months as we go.

## 2021-01-15 LAB — LIPID PANEL
Cholesterol: 184 mg/dL (ref <200)
HDL: 65 mg/dL (ref >=50)
LDL Cholesterol (Calc): 97 mg/dL (calc)
Non-HDL Cholesterol (Calc): 119 mg/dL (calc) (ref <130)
Total CHOL/HDL Ratio: 2.8 (calc) (ref <5.0)
Triglycerides: 123 mg/dL (ref <150)

## 2021-01-15 LAB — CBC
HCT: 41.1 % (ref 35.0–45.0)
Hemoglobin: 13.2 g/dL (ref 11.7–15.5)
MCH: 29.9 pg (ref 27.0–33.0)
MCHC: 32.1 g/dL (ref 32.0–36.0)
MCV: 93.2 fL (ref 80.0–100.0)
MPV: 11.2 fL (ref 7.5–12.5)
Platelets: 252 10*3/uL (ref 140–400)
RBC: 4.41 10*6/uL (ref 3.80–5.10)
RDW: 12.4 % (ref 11.0–15.0)
WBC: 5.3 10*3/uL (ref 3.8–10.8)

## 2021-01-15 LAB — HEMOGLOBIN A1C
Hgb A1c MFr Bld: 5.8 % of total Hgb — ABNORMAL HIGH (ref <5.7)
Mean Plasma Glucose: 120 mg/dL
eAG (mmol/L): 6.6 mmol/L

## 2021-01-15 LAB — COMPREHENSIVE METABOLIC PANEL
AG Ratio: 1.5 (calc) (ref 1.0–2.5)
ALT: 25 U/L (ref 6–29)
AST: 27 U/L (ref 10–35)
Albumin: 4.2 g/dL (ref 3.6–5.1)
Alkaline phosphatase (APISO): 44 U/L (ref 37–153)
BUN/Creatinine Ratio: 8 (calc) (ref 6–22)
BUN: 9 mg/dL (ref 7–25)
CO2: 26 mmol/L (ref 20–32)
Calcium: 9.4 mg/dL (ref 8.6–10.4)
Chloride: 105 mmol/L (ref 98–110)
Creat: 1.14 mg/dL — ABNORMAL HIGH (ref 0.50–1.05)
Globulin: 2.8 g/dL (calc) (ref 1.9–3.7)
Glucose, Bld: 102 mg/dL — ABNORMAL HIGH (ref 65–99)
Potassium: 4.2 mmol/L (ref 3.5–5.3)
Sodium: 139 mmol/L (ref 135–146)
Total Bilirubin: 0.4 mg/dL (ref 0.2–1.2)
Total Protein: 7 g/dL (ref 6.1–8.1)

## 2021-01-17 ENCOUNTER — Other Ambulatory Visit: Payer: Self-pay | Admitting: Sports Medicine

## 2021-01-18 DIAGNOSIS — E669 Obesity, unspecified: Secondary | ICD-10-CM

## 2021-01-18 DIAGNOSIS — E8881 Metabolic syndrome: Secondary | ICD-10-CM

## 2021-01-19 ENCOUNTER — Other Ambulatory Visit: Payer: Self-pay

## 2021-01-19 DIAGNOSIS — L219 Seborrheic dermatitis, unspecified: Secondary | ICD-10-CM

## 2021-01-19 MED ORDER — CLOBETASOL PROPIONATE 0.05 % EX OINT: 1.0000 "application " | TOPICAL_OINTMENT | Freq: Two times a day (BID) | CUTANEOUS | 3 refills | Status: DC

## 2021-01-19 MED ORDER — TRIAMCINOLONE ACETONIDE 0.05 % EX OINT
TOPICAL_OINTMENT | CUTANEOUS | 3 refills | Status: DC
Start: 2021-01-19 — End: 2022-08-24

## 2021-01-19 MED ORDER — METFORMIN HCL ER 750 MG PO TB24: 750.0000 mg | ORAL_TABLET | Freq: Every day | ORAL | 11 refills | Status: DC

## 2021-01-26 ENCOUNTER — Other Ambulatory Visit: Payer: Self-pay | Admitting: Sports Medicine

## 2021-01-26 DIAGNOSIS — E8881 Metabolic syndrome: Secondary | ICD-10-CM

## 2021-01-26 DIAGNOSIS — E669 Obesity, unspecified: Secondary | ICD-10-CM

## 2021-02-04 ENCOUNTER — Telehealth: Payer: Self-pay

## 2021-02-04 NOTE — Telephone Encounter (Signed)
Medication: Phendimetrazine Tartrate 35 MG TABS Prior authorization submitted via CoverMyMeds on 02/04/2021 PA submission pending

## 2021-02-04 NOTE — Telephone Encounter (Signed)
Medication: Phendimetrazine Tartrate 35 MG TABS Prior authorization determination received Medication has been approved Approval dates: 01/05/2021-05/05/2021  Patient aware via: MyChart Pharmacy aware: Yes Provider aware via this encounter

## 2021-02-04 NOTE — Telephone Encounter (Signed)
Medication: Dulaglutide (TRULICITY) 0.75 MG/0.5ML SOPN Per CoverMyMeds this medication is covered under her benefit plan and does not need a prior authorization  Patient aware via: MyChart Pharmacy aware: Yes Provider aware via this encounter

## 2021-02-04 NOTE — Telephone Encounter (Signed)
Medication: Dulaglutide (TRULICITY) 0.75 MG/0.5ML SOPN Prior authorization submitted via CoverMyMeds on 02/04/2021 PA submission pending

## 2021-02-11 ENCOUNTER — Ambulatory Visit: Payer: Medicare Other | Admitting: Sports Medicine

## 2021-02-11 ENCOUNTER — Other Ambulatory Visit: Payer: Self-pay

## 2021-02-11 DIAGNOSIS — E669 Obesity, unspecified: Secondary | ICD-10-CM

## 2021-02-11 DIAGNOSIS — E8881 Metabolic syndrome: Secondary | ICD-10-CM

## 2021-02-14 ENCOUNTER — Telehealth: Payer: Medicare Other | Admitting: Family

## 2021-02-14 DIAGNOSIS — R399 Unspecified symptoms and signs involving the genitourinary system: Secondary | ICD-10-CM

## 2021-02-14 MED ORDER — CEPHALEXIN 500 MG PO CAPS: 500.0000 mg | ORAL_CAPSULE | Freq: Two times a day (BID) | ORAL | 0 refills | Status: DC

## 2021-02-14 NOTE — Progress Notes (Signed)

## 2021-03-04 ENCOUNTER — Telehealth: Payer: Medicare Other | Admitting: Physician Assistant

## 2021-03-04 DIAGNOSIS — R3989 Other symptoms and signs involving the genitourinary system: Secondary | ICD-10-CM

## 2021-03-04 MED ORDER — CEPHALEXIN 500 MG PO CAPS
500.0000 mg | ORAL_CAPSULE | Freq: Two times a day (BID) | ORAL | 0 refills | Status: DC
Start: 2021-03-04 — End: 2021-07-27

## 2021-03-04 NOTE — Progress Notes (Signed)

## 2021-03-25 ENCOUNTER — Other Ambulatory Visit: Payer: Self-pay | Admitting: Sports Medicine

## 2021-03-25 ENCOUNTER — Encounter: Payer: Self-pay | Admitting: Sports Medicine

## 2021-03-25 DIAGNOSIS — E669 Obesity, unspecified: Secondary | ICD-10-CM

## 2021-03-25 DIAGNOSIS — E8881 Metabolic syndrome: Secondary | ICD-10-CM

## 2021-03-25 MED ORDER — PHENDIMETRAZINE TARTRATE 35 MG PO TABS: ORAL_TABLET | ORAL | 0 refills | Status: DC

## 2021-03-25 MED ORDER — TRULICITY 1.5 MG/0.5ML ~~LOC~~ SOAJ: 1.5000 mg | SUBCUTANEOUS | 11 refills | Status: DC

## 2021-04-11 ENCOUNTER — Encounter: Payer: Self-pay | Admitting: Sports Medicine

## 2021-04-11 DIAGNOSIS — E669 Obesity, unspecified: Secondary | ICD-10-CM

## 2021-04-11 MED ORDER — MOUNJARO 2.5 MG/0.5ML ~~LOC~~ SOAJ
2.5000 mg | SUBCUTANEOUS | 0 refills | Status: DC
Start: 2021-04-11 — End: 2021-05-04

## 2021-04-14 ENCOUNTER — Telehealth: Payer: Self-pay

## 2021-04-14 NOTE — Telephone Encounter (Signed)
Medication: tirzepatide Greggory Keen) 2.5 MG/0.5ML Pen Prior authorization submitted via CoverMyMeds on 04/14/2021 PA submission pending

## 2021-04-14 NOTE — Telephone Encounter (Signed)
Medication: tirzepatide Greggory Keen) 2.5 MG/0.5ML Pen Prior authorization determination received Medication has been approved Approval dates: 03/15/2021-04/23/2098  Patient aware via: MyChart Pharmacy aware: Yes Provider aware via this encounter

## 2021-04-28 ENCOUNTER — Telehealth: Payer: Self-pay

## 2021-04-28 NOTE — Telephone Encounter (Signed)
Medication: Phendimetrazine Tartrate 35 MG TABS Prior authorization determination received Medication has been approved Approval dates: 03/29/2021-04/28/2022  Patient aware via: MyChart Pharmacy aware: Yes Provider aware via this encounter

## 2021-04-28 NOTE — Telephone Encounter (Signed)
Medication: Phendimetrazine Tartrate 35 MG TABS Prior authorization submitted via CoverMyMeds on 04/28/2021 PA submission pending

## 2021-05-04 ENCOUNTER — Encounter: Payer: Self-pay | Admitting: Sports Medicine

## 2021-05-04 DIAGNOSIS — E669 Obesity, unspecified: Secondary | ICD-10-CM

## 2021-05-04 DIAGNOSIS — E8881 Metabolic syndrome: Secondary | ICD-10-CM

## 2021-05-04 MED ORDER — MOUNJARO 5 MG/0.5ML ~~LOC~~ SOAJ: 5.0000 mg | SUBCUTANEOUS | 0 refills | Status: DC

## 2021-05-15 ENCOUNTER — Other Ambulatory Visit: Payer: Self-pay | Admitting: Sports Medicine

## 2021-05-15 DIAGNOSIS — E669 Obesity, unspecified: Secondary | ICD-10-CM

## 2021-05-27 MED ORDER — MOUNJARO 7.5 MG/0.5ML ~~LOC~~ SOAJ: 7.5000 mg | SUBCUTANEOUS | 0 refills | Status: DC

## 2021-05-27 MED ORDER — MOUNJARO 12.5 MG/0.5ML ~~LOC~~ SOAJ: 12.5000 mg | SUBCUTANEOUS | 0 refills | Status: DC

## 2021-05-27 MED ORDER — MOUNJARO 10 MG/0.5ML ~~LOC~~ SOAJ
10.0000 mg | SUBCUTANEOUS | 0 refills | Status: DC
Start: 2021-05-27 — End: 2021-07-08

## 2021-05-27 NOTE — Addendum Note (Signed)
Addended by: Monica Becton on: 05/27/2021 10:14 AM   Modules accepted: Orders

## 2021-05-27 NOTE — Assessment & Plan Note (Signed)
Continues to do well with Mounjaro, increasing to the 7.5 mg dose, also going to call him the subsequent 2 doses as well.

## 2021-07-08 ENCOUNTER — Encounter: Payer: Self-pay | Admitting: Sports Medicine

## 2021-07-08 DIAGNOSIS — E669 Obesity, unspecified: Secondary | ICD-10-CM

## 2021-07-08 MED ORDER — MOUNJARO 10 MG/0.5ML ~~LOC~~ SOAJ: 10.0000 mg | SUBCUTANEOUS | 3 refills | Status: DC

## 2021-07-11 MED ORDER — MOUNJARO 10 MG/0.5ML ~~LOC~~ SOAJ: 10.0000 mg | SUBCUTANEOUS | 3 refills | Status: DC

## 2021-07-11 NOTE — Addendum Note (Signed)
Addended by: Chalmers Cater on: 07/11/2021 09:48 AM ? ? Modules accepted: Orders ? ?

## 2021-07-14 ENCOUNTER — Other Ambulatory Visit: Payer: Self-pay | Admitting: Sports Medicine

## 2021-07-14 DIAGNOSIS — Z1231 Encounter for screening mammogram for malignant neoplasm of breast: Secondary | ICD-10-CM

## 2021-07-19 MED ORDER — MOUNJARO 10 MG/0.5ML ~~LOC~~ SOAJ: 10.0000 mg | SUBCUTANEOUS | 11 refills | Status: DC

## 2021-07-19 NOTE — Addendum Note (Signed)
Addended by: Chalmers Cater on: 07/19/2021 09:56 AM ? ? Modules accepted: Orders ? ?

## 2021-07-27 ENCOUNTER — Emergency Department (INDEPENDENT_AMBULATORY_CARE_PROVIDER_SITE_OTHER)
Admission: RE | Admit: 2021-07-27 | Discharge: 2021-07-27 | Disposition: A | Discharge status: Home / Self Care | Payer: Medicare Other | Source: Ambulatory Visit | Attending: Family Medicine | Admitting: Family Medicine

## 2021-07-27 VITALS — BP 123/82 | HR 71 | Temp 98.1°F | Resp 16 | Ht 62.75 in | Wt 154.0 lb

## 2021-07-27 DIAGNOSIS — R42 Dizziness and giddiness: Secondary | ICD-10-CM | POA: Diagnosis not present

## 2021-07-27 NOTE — Discharge Instructions (Addendum)
Take meclizine ( Bonine OTC) if needed ?Do Epley exercises ?Drink lots of water ?Call here or PCP if worse ? ?

## 2021-07-27 NOTE — ED Provider Notes (Signed)
?[facility] ? ? ? ?CSN: [id number] ?Arrival date & time: 07/27/21  1258 ? ? ?  ? ?History   ?Chief Complaint ?Chief Complaint  ?Patient presents with  ? Dizziness  ?  Suspect that I may have BPPV. Not sure, had it once a few years ago. I have dizziness, lightheaded and nausea. - Entered by patient  ? ? ?HPI ?Ann [name] is a 66 y.o. female.  ? ?HPI ? ?This patient has dizziness for 2 to 3 days.  She states that she got up and had a normal day, and decided to ride her exercise bike for 20 minutes.  After that she took a shower.  When she leaned her head back to rinse soap out of her hair she got the sudden onset of a spinning/vertigo attack.  This is accompanied by nausea.  She sat down and it subsided after a few minutes.  He did make her feel little bit anxious, states that her heart was "pounding".  No chest pain or shortness of breath.  Ever since then she has had a couple recurrences of spinning with nausea.  No vomiting.  She has had no injury or head trauma.  She has no signs of infection or headache.  Hearing is normal.  States she does have allergies and periodically gets a pressure sensation in her left ear. ?She had vertigo in 2017.  She was treated with exercises.  She states this took care of the problem without medication. ? ?Past Medical History:  ?Diagnosis Date  ? Alcoholism (HCC)   ? Degenerative disc disease   ? Hyperlipemia   ? Osteoporosis   ? ? ?Patient Active Problem List  ? Diagnosis Date Noted  ? 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 Dyspepsia 07/27/2020  ? Anxiety and depression 11/04/2019  ? Urinary urgency 08/07/2017  ? Trochanteric bursitis, right hip 06/01/2016  ? Primary osteoarthritis of right knee 06/01/2016  ? Abdominal obesity and metabolic syndrome 06/01/2016  ? Restless leg syndrome 01/21/2016  ? Annual physical exam 11/12/2015  ? DDD (degenerative disc disease), cervical 11/12/2015  ? Benign paroxysmal positional vertigo 09/22/2015  ?  Chronic bronchitis (HCC) 12/30/2014  ? Pulmonary nodule 09/19/2013  ? Hyperlipidemia 09/12/2013  ? Former smoker 09/12/2013  ? Lumbar degenerative disc disease 08/28/2012  ? ? ?Past Surgical History:  ?Procedure Laterality Date  ? CYSTOCELE REPAIR    ? TUBAL LIGATION    ? ? ?OB History   ?No obstetric history on file. ?  ? ? ? ?Home Medications   ? ?Prior to Admission medications   ?Medication Sig Start Date End Date Taking? Authorizing Provider  ?clobetasol ointment (TEMOVATE) 0.05 % Apply 1 application topically 2 (two) times daily. To affected areas on hand, avoid face 01/19/21  Yes Monica Becton, MD  ?escitalopram (LEXAPRO) 5 MG tablet Take 1 tablet (5 mg total) by mouth daily. 08/24/20  Yes Monica Becton, MD  ?metFORMIN (GLUCOPHAGE XR) 750 MG 24 hr tablet Take 1 tablet (750 mg total) by mouth daily with breakfast. 01/19/21  Yes Monica Becton, MD  ?Multiple Vitamin (MULTIVITAMIN WITH MINERALS) TABS tablet Take 1 tablet by mouth daily. 12/14/14  Yes Thermon Leyland, NP  ?pantoprazole (PROTONIX) 40 MG tablet TAKE 1 TABLET TWICE A DAY 11/30/20  Yes Monica Becton, MD  ?Phendimetrazine Tartrate 35 MG TABS 1 tab p.o. 3 times daily 03/25/21  Yes Monica Becton, MD  ?rosuvastatin (CRESTOR) 40 MG tablet Take 1  tablet (40 mg total) by mouth daily. 11/30/20  Yes Monica Bectonhekkekandam, Thomas J, MD  ?tirzepatide [facility](MOUNJARO) 10 MG/0.5ML Pen Inject 10 mg into the skin once a week. 07/19/21  Yes Monica Bectonhekkekandam, Thomas J, MD  ?TRIAMCINOLONE ACETONIDE, TOP, 0.05 % OINT Apply twice daily to affected areas on face 01/19/21  Yes Monica Bectonhekkekandam, Thomas J, MD  ?varenicline (CHANTIX) 1 MG tablet TAKE 1 TABLET TWICE A DAY 01/18/21  Yes Monica Bectonhekkekandam, Thomas J, MD  ? ? ?Family History ?Family History  ?Problem Relation Age of Onset  ? Cancer Mother   ?     breast  ? Alcohol abuse Mother   ? Cancer Father   ?     prostate  ? Alcohol abuse Paternal Uncle   ? Alcohol abuse Maternal Grandfather   ? Alcohol abuse Paternal  Grandfather   ? ? ?Social History ?Social History  ? ?Tobacco Use  ? Smoking status: Former  ?  Packs/day: 0.50  ?  Years: 40.00  ?  Pack years: 20.00  ?  Types: Cigarettes  ? Smokeless tobacco: Never  ?Vaping Use  ? Vaping Use: Never used  ?Substance Use Topics  ? Alcohol use: Not Currently  ?  Alcohol/week: 70.0 standard drinks  ?  Types: 70 Cans of beer per week  ? Drug use: No  ? ? ? ?Allergies   ?Naproxen and Desyrel [trazodone] ? ? ?Review of Systems ?Review of Systems ?See HPI ? ?Physical Exam ?Triage Vital Signs ?ED Triage Vitals  ?Enc Vitals Group  ?   BP 07/27/21 1327 123/82  ?   Pulse Rate 07/27/21 1327 71  ?   Resp 07/27/21 1327 16  ?   Temp 07/27/21 1327 98.1 ?F (36.7 ?C)  ?   Temp Source 07/27/21 1327 Oral  ?   SpO2 07/27/21 1327 97 %  ?   Weight 07/27/21 1324 154 lb (69.9 kg)  ?   Height 07/27/21 1324 5' 2.75" (1.594 m)  ?   Head Circumference --   ?   Peak Flow --   ?   Pain Score 07/27/21 1324 0  ?   Pain Loc --   ?   Pain Edu? --   ?   Excl. in GC? --   ? ?No data found. ? ?Updated Vital Signs ?BP 123/82 (BP Location: Left Arm)   Pulse 71   Temp 98.1 ?F (36.7 ?C) (Oral)   Resp 16   Ht 5' 2.75" (1.594 m)   Wt 69.9 kg   SpO2 97%   BMI 27.50 kg/m?  ?   ? ?Physical Exam ?Constitutional:   ?   General: She is not in acute distress. ?   Appearance: She is well-developed. She is not ill-appearing.  ?HENT:  ?   Head: Normocephalic and atraumatic.  ?   Right Ear: Tympanic membrane and ear canal normal.  ?   Left Ear: Tympanic membrane and ear canal normal.  ?   Nose: Nose normal. No congestion or rhinorrhea.  ?   Mouth/Throat:  ?   Mouth: Mucous membranes are moist.  ?   Pharynx: No posterior oropharyngeal erythema.  ?   Comments: Edentulous ?Eyes:  ?   Conjunctiva/sclera: Conjunctivae normal.  ?   Pupils: Pupils are equal, round, and reactive to light.  ?   Comments: 1 beat nystagmus with leftward gaze  ?Neck:  ?   Vascular: No carotid bruit.  ?Cardiovascular:  ?   Rate and Rhythm: Normal rate and  regular rhythm.  ?  Heart sounds: Normal heart sounds.  ?Pulmonary:  ?   Effort: Pulmonary effort is normal. No respiratory distress.  ?   Breath sounds: Normal breath sounds. No wheezing or rhonchi.  ?Abdominal:  ?   General: There is no distension.  ?   Palpations: Abdomen is soft.  ?Musculoskeletal:     ?   General: Normal range of motion.  ?   Cervical back: Normal range of motion and neck supple.  ?   Right lower leg: No edema.  ?   Left lower leg: No edema.  ?Lymphadenopathy:  ?   Cervical: No cervical adenopathy.  ?Skin: ?   General: Skin is warm and dry.  ?Neurological:  ?   General: No focal deficit present.  ?   Mental Status: She is alert.  ?   Motor: No weakness.  ?   Coordination: Coordination normal.  ?   Gait: Gait normal.  ?   Deep Tendon Reflexes: Reflexes normal.  ?Psychiatric:     ?   Mood and Affect: Mood normal.     ?   Behavior: Behavior normal.  ? ? ? ?UC Treatments / Results  ?Labs ?(all labs ordered are listed, but only abnormal results are displayed) ?Labs Reviewed - No data to display ? ?EKG ? ? ?Radiology ?No results found. ? ?Procedures ?Procedures (including critical care time) ? ?Medications Ordered in UC ?Medications - No data to display ? ?Initial Impression / Assessment and Plan / UC Course  ?I have reviewed the triage vital signs and the nursing notes. ? ?Pertinent labs & imaging results that were available during my care of the patient were reviewed by me and considered in my medical decision making (see chart for details). ? ?  ?Final Clinical Impressions(s) / UC Diagnoses  ? ?Final diagnoses:  ?Vertigo  ? ? ? ?Discharge Instructions   ? ?  ?Take meclizine ( [facility]) if needed ?Do Epley exercises ?Drink lots of water ?Call here or PCP if worse ? ? ? ?ED Prescriptions   ?None ?  ? ?PDMP not reviewed this encounter. ?  ?Eustace Moore, MD ?07/27/21 1359 ? ?

## 2021-07-27 NOTE — ED Triage Notes (Signed)
Pt states that she has some dizziness, fatigue and nausea. X3 days ? ? ?Pt states that she is vaccinated for covid  ?Pt states that she has had flu vaccine.  ?

## 2021-07-28 ENCOUNTER — Encounter: Payer: Self-pay | Admitting: Sports Medicine

## 2021-07-28 DIAGNOSIS — E8881 Metabolic syndrome: Secondary | ICD-10-CM

## 2021-07-28 DIAGNOSIS — E669 Obesity, unspecified: Secondary | ICD-10-CM

## 2021-07-28 MED ORDER — MOUNJARO 5 MG/0.5ML ~~LOC~~ SOAJ: 5.0000 mg | SUBCUTANEOUS | 11 refills | Status: DC

## 2021-08-01 ENCOUNTER — Other Ambulatory Visit: Payer: Self-pay | Admitting: Sports Medicine

## 2021-08-01 ENCOUNTER — Encounter: Payer: Self-pay | Admitting: Sports Medicine

## 2021-08-01 DIAGNOSIS — F32A Depression, unspecified: Secondary | ICD-10-CM

## 2021-08-01 DIAGNOSIS — H8110 Benign paroxysmal vertigo, unspecified ear: Secondary | ICD-10-CM

## 2021-08-01 NOTE — Telephone Encounter (Signed)
Referral pended, sign if appropriate.  ?

## 2021-08-04 ENCOUNTER — Ambulatory Visit: Payer: Medicare Other | Attending: Sports Medicine | Admitting: Physical Therapy

## 2021-08-04 ENCOUNTER — Ambulatory Visit: Payer: Medicare Other

## 2021-08-04 DIAGNOSIS — H8111 Benign paroxysmal vertigo, right ear: Secondary | ICD-10-CM | POA: Diagnosis not present

## 2021-08-04 DIAGNOSIS — R42 Dizziness and giddiness: Secondary | ICD-10-CM

## 2021-08-04 DIAGNOSIS — H8110 Benign paroxysmal vertigo, unspecified ear: Secondary | ICD-10-CM | POA: Diagnosis present

## 2021-08-04 NOTE — Addendum Note (Signed)
Addended by: Jules Husbands MARIE L on: 08/04/2021 01:54 PM ? ? Modules accepted: Orders ? ?

## 2021-08-04 NOTE — Therapy (Signed)
[place] [facility] ?[address] ?[place], [place], [address] ?Phone: [phone]   Fax:  [phone] ? ?Physical Therapy Evaluation ? ?Patient Details  ?Name: Ann [name] ?MRN: [id number] ?Date of Birth: 11/09/1955 ?No data recorded ? ?Encounter Date: 08/04/2021 ? ? PT End of Session - 08/04/21 1050   ? ? Visit Number 1   ? Number of Visits 6   ? Date for PT Re-Evaluation 09/15/21   ? Authorization Type Medicare   ? PT Start Time [phone]   ? PT Stop Time 0935   ? PT Time Calculation (min) 45 min   ? Activity Tolerance Patient tolerated treatment well   ? Behavior [facility] for tasks assessed/performed   ? ?  ?  ? ?  ? ? ?Past Medical History:  ?Diagnosis Date  ? Alcoholism (HCC)   ? Degenerative disc disease   ? Hyperlipemia   ? Osteoporosis   ? ? ?Past Surgical History:  ?Procedure Laterality Date  ? CYSTOCELE REPAIR    ? TUBAL LIGATION    ? ? ?There were no vitals filed for this visit. ? ? ? Subjective Assessment - 08/04/21 0854   ? ? Subjective Pt reports last Monday she did exercises on her inside bike and took a shower. Pt was rinsing her hair and pushed her head back and got super dizzy. Pt saw MD who thought it was BPPV. Pt has had it once 7 years ago. It has been on and off. Pt tried Epley maneuver but did not look the same as what the therapist did 7 years ago. Pt reports it's been better or worse.   ? ?  ?  ? ?  ? ? ? ? ? ? ? ? ? ? ? ? ? Vestibular Assessment - 08/04/21 0001   ? ?  ? Symptom Behavior  ? Type of Dizziness  Imbalance   "A little bit drunk"  ? Frequency of Dizziness Intermittent   ? Duration of Dizziness Normally settles after 20 min; but yesterday it was all night   ? Symptom Nature Motion provoked   ? Aggravating Factors Forward bending   Showering  ? Relieving Factors Head stationary;Rest   ? Progression of Symptoms Better   ?  ? Oculomotor Exam  ? Oculomotor Alignment Normal   ? Ocular ROM WFL   ? Spontaneous Absent   ? Gaze-induced   Absent   ? Head shaking Horizontal Absent   ? Head Shaking Vertical Absent   ? Smooth Pursuits Intact   ? Saccades Intact   ? Comment mild corrective saccades with L>R HIT   ?  ? Vestibulo-Ocular Reflex  ? VOR 1 Head Only (x 1 viewing) WFL   ? VOR to Slow Head Movement Normal   ?  ? Positional Testing  ? Dix-Hallpike Dix-Hallpike Right;Dix-Hallpike Left   ? Sidelying Test Sidelying Right;Sidelying Left   ?  ? Dix-Hallpike Right  ? Dix-Hallpike Right Duration 0   ? Dix-Hallpike Right Symptoms No nystagmus   ?  ? Dix-Hallpike Left  ? Dix-Hallpike Left Duration 0   ? Dix-Hallpike Left Symptoms No nystagmus   ?  ? Sidelying Right  ? Sidelying Right Duration ~10 sec   ? Sidelying Right Symptoms No nystagmus   Pt reports "lightheaded"  ?  ? Sidelying Left  ? Sidelying Left Duration <5 sec   ? Sidelying Left Symptoms No nystagmus   Reports lightheadedness  ? ?  ?  ? ?  ? ? ? ? ? ?  Objective measurements completed on examination: See above findings.  ? ? ? ? ? ? Vestibular Treatment/Exercise - 08/04/21 0001   ? ?  ? Vestibular Treatment/Exercise  ? Vestibular Treatment Provided Canalith Repositioning   ? Canalith Repositioning Appiani Right;Appiani Left   ?  ? Appiani Right  ? Number of Reps  2   ? Overall Response  Improved Symptoms   ? Response Details  No lightheadedness when placed in sidelying to the right after first Appiani maneuver; lightheadedness with return to sitting   ?  ? Appiani Left  ? Number of Reps  1   ? Overall Response  Improved Symptoms   ? ?  ?  ? ?  ? ? ? ? ? ? ? ? ? PT Education - 08/04/21 1050   ? ? Education Details Discussed BPPV and exam findings. Discussed home canalith repositioning.   ? Person(s) Educated Patient;Spouse   ? Methods Explanation;Demonstration;Tactile cues;Verbal cues;Handout   ? Comprehension Verbalized understanding;Returned demonstration;Verbal cues required;Tactile cues required   ? ?  ?  ? ?  ? ? ? ? ? ? PT Long Term Goals - 08/04/21 1349   ? ?  ? PT LONG TERM GOAL #1  ?  Title Ind with checking and addressing BPPV at home   ? Time 6   ? Period Weeks   ? Status New   ? Target Date 09/15/21   ?  ? PT LONG TERM GOAL #2  ? Title (-) symptoms in all canalith positions to demo resolution of BPPV   ? Time 6   ? Period Weeks   ? Status New   ? Target Date 09/15/21   ?  ? PT LONG TERM GOAL #3  ? Title Pt will report resolution of her dizzy symptoms   ? Time 6   ? Period Weeks   ? Status New   ? Target Date 09/15/21   ? ?  ?  ? ?  ? ? ? ? ? ? ? ? ? Plan - 08/04/21 1058   ? ? Clinical Impression Statement Ms. Ann [name] is a 66 y/o F presenting to OPPT for BPPV and ongoing dizzy symptoms. Pt had performed Epley maneuver at home and felt very nauseous afterwards -- thought she might have performed it wrong. On assessment, pt is (-) in Dix-Hallpike position with no symptoms or nystagmus noted. Sidelying L & R increased some of her symptoms but unable to visualize any nystagmus at this time. S/s suggest pt has residual otoconia in bilat horizontal canalith (R>L) - possibly due to migration of otoconia when she performed Epley at home. Performed Guffoni maneuver this session. Provided pt education to perform at home. Pt to follow up PRN weekly if continued deficits.   ? Personal Factors and Comorbidities Age;Time since onset of injury/illness/exacerbation;Past/Current Experience   ? Examination-Activity Limitations Hygiene/Grooming;Locomotion Level;Bend   ? Examination-Participation Restrictions Cleaning;Community Activity;Meal Prep;Laundry   ? Stability/Clinical Decision Making Stable/Uncomplicated   ? Clinical Decision Making Low   ? Rehab Potential Good   ? PT Frequency 1x / week   ? PT Duration 6 weeks   ? PT Treatment/Interventions ADLs/Self Care Home Management;Therapeutic activities;Therapeutic exercise;Neuromuscular re-education;Balance training;Vestibular;Canalith Repostioning   ? PT Next Visit Plan Re-check canalith for BPPV. Initiate gaze stabilization/VOR exercises as indicated.   ? PT  Home Exercise Plan Guffoni maneuver for home   ? Consulted and Agree with Plan of Care Patient;Family member/caregiver   ? Family Member Consulted Husband   ? ?  ?  ? ?  ? ? ?  Patient will benefit from skilled therapeutic intervention in order to improve the following deficits and impairments:  Dizziness, Decreased balance, Postural dysfunction, Decreased mobility ? ?Visit Diagnosis: ?Dizziness and giddiness ? ?BPPV (benign paroxysmal positional vertigo), right ? ? ? ? ?Problem List ?Patient Active Problem List  ? Diagnosis Date Noted  ? 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 Dyspepsia 07/27/2020  ? Anxiety and depression 11/04/2019  ? Urinary urgency 08/07/2017  ? Trochanteric bursitis, right hip 06/01/2016  ? Primary osteoarthritis of right knee 06/01/2016  ? Abdominal obesity and metabolic syndrome 06/01/2016  ? Restless leg syndrome 01/21/2016  ? Annual physical exam 11/12/2015  ? DDD (degenerative disc disease), cervical 11/12/2015  ? Benign paroxysmal positional vertigo 09/22/2015  ? Chronic bronchitis (HCC) 12/30/2014  ? Pulmonary nodule 09/19/2013  ? Hyperlipidemia 09/12/2013  ? Former smoker 09/12/2013  ? Lumbar degenerative disc disease 08/28/2012  ? ? ?Sakina Briones April Dell Ponto, PT, DPT ?08/04/2021, 1:52 PM ? ?[place] ?[facility] ?[address] ?[place], [place], [address] ?Phone: [phone]   Fax:  [phone] ? ?Name: Ann [name] ?MRN: [id number] ?Date of Birth: [birth year] ? ? ?

## 2021-08-12 ENCOUNTER — Ambulatory Visit: Payer: Medicare Other | Admitting: Physical Therapy

## 2021-08-18 ENCOUNTER — Ambulatory Visit (INDEPENDENT_AMBULATORY_CARE_PROVIDER_SITE_OTHER): Payer: Medicare Other

## 2021-08-18 DIAGNOSIS — Z1231 Encounter for screening mammogram for malignant neoplasm of breast: Secondary | ICD-10-CM | POA: Diagnosis not present

## 2021-08-19 ENCOUNTER — Ambulatory Visit (INDEPENDENT_AMBULATORY_CARE_PROVIDER_SITE_OTHER): Payer: Medicare Other | Admitting: Sports Medicine

## 2021-08-19 VITALS — BP 101/68 | HR 76 | Wt 151.0 lb

## 2021-08-19 DIAGNOSIS — N281 Cyst of kidney, acquired: Secondary | ICD-10-CM

## 2021-08-19 DIAGNOSIS — R739 Hyperglycemia, unspecified: Secondary | ICD-10-CM

## 2021-08-19 DIAGNOSIS — H8113 Benign paroxysmal vertigo, bilateral: Secondary | ICD-10-CM

## 2021-08-19 DIAGNOSIS — E785 Hyperlipidemia, unspecified: Secondary | ICD-10-CM

## 2021-08-19 DIAGNOSIS — Z87891 Personal history of nicotine dependence: Secondary | ICD-10-CM

## 2021-08-19 DIAGNOSIS — M7061 Trochanteric bursitis, right hip: Secondary | ICD-10-CM | POA: Diagnosis not present

## 2021-08-19 MED ORDER — MECLIZINE HCL 25 MG PO TABS: 25.0000 mg | ORAL_TABLET | Freq: Three times a day (TID) | ORAL | 0 refills | Status: DC | PRN

## 2021-08-19 NOTE — Progress Notes (Addendum)
? ? ?  Procedures performed today:   ? ?None. ? ?Independent interpretation of notes and tests performed by another provider:  ? ?None. ? ?Brief History, Exam, Impression, and Recommendations:   ? ?Former smoker ?Former smoker, just quit, over 30-pack-year smoking history. ?Adding screening chest CT. ? ?Trochanteric bursitis, right hip ?Recurrence of right hip pain, lateral aspect with tenderness over the greater trochanter, good internal rotation and x-rays from recent did not show much arthritis in the hip joint itself. ?I would like her to work with physical therapy for about 4 to 6 weeks before considering trochanteric bursa injection ? ?Benign paroxysmal positional vertigo ?Recurrence of symptoms of mine benign paroxysmal positional vertigo worse with head position changes, no tinnitus, ear pressure, no trauma, ear canals are unremarkable. ?Improved with intermittent use of the Epley maneuver, she will work with vestibular rehab and I will refill her meclizine for now. ? ?Renal cyst, left ?Incidentally noted left renal mass, ordering ultrasound. ? ? ? ?___________________________________________ ?Ihor Austin. Benjamin Stain, M.D., ABFM., CAQSM. ?Primary Care and Sports Medicine ?[facility] ? ?Adjunct Instructor of Family Medicine  ?[employer] ?

## 2021-08-19 NOTE — Assessment & Plan Note (Signed)
Recurrence of right hip pain, lateral aspect with tenderness over the greater trochanter, good internal rotation and x-rays from recent did not show much arthritis in the hip joint itself. ?I would like her to work with physical therapy for about 4 to 6 weeks before considering trochanteric bursa injection ?

## 2021-08-19 NOTE — Assessment & Plan Note (Signed)
Former smoker, just quit, over 30-pack-year smoking history. ?Adding screening chest CT. ?

## 2021-08-19 NOTE — Assessment & Plan Note (Signed)
Recurrence of symptoms of mine benign paroxysmal positional vertigo worse with head position changes, no tinnitus, ear pressure, no trauma, ear canals are unremarkable. ?Improved with intermittent use of the Epley maneuver, she will work with vestibular rehab and I will refill her meclizine for now. ?

## 2021-08-20 LAB — CBC
HCT: 40.2 % (ref 35.0–45.0)
Hemoglobin: 13.5 g/dL (ref 11.7–15.5)
MCH: 31.3 pg (ref 27.0–33.0)
MCHC: 33.6 g/dL (ref 32.0–36.0)
MCV: 93.3 fL (ref 80.0–100.0)
MPV: 12.1 fL (ref 7.5–12.5)
Platelets: 233 10*3/uL (ref 140–400)
RBC: 4.31 10*6/uL (ref 3.80–5.10)
RDW: 12.4 % (ref 11.0–15.0)
WBC: 5.3 10*3/uL (ref 3.8–10.8)

## 2021-08-20 LAB — COMPREHENSIVE METABOLIC PANEL
AG Ratio: 1.4 (calc) (ref 1.0–2.5)
ALT: 23 U/L (ref 6–29)
AST: 27 U/L (ref 10–35)
Albumin: 4.2 g/dL (ref 3.6–5.1)
Alkaline phosphatase (APISO): 42 U/L (ref 37–153)
BUN/Creatinine Ratio: 17 (calc) (ref 6–22)
BUN: 18 mg/dL (ref 7–25)
CO2: 25 mmol/L (ref 20–32)
Calcium: 9.4 mg/dL (ref 8.6–10.4)
Chloride: 105 mmol/L (ref 98–110)
Creat: 1.06 mg/dL — ABNORMAL HIGH (ref 0.50–1.05)
Globulin: 3 g/dL (calc) (ref 1.9–3.7)
Glucose, Bld: 89 mg/dL (ref 65–99)
Potassium: 4.4 mmol/L (ref 3.5–5.3)
Sodium: 139 mmol/L (ref 135–146)
Total Bilirubin: 0.4 mg/dL (ref 0.2–1.2)
Total Protein: 7.2 g/dL (ref 6.1–8.1)

## 2021-08-20 LAB — HEMOGLOBIN A1C
Hgb A1c MFr Bld: 5.3 % of total Hgb (ref <5.7)
Mean Plasma Glucose: 105 mg/dL
eAG (mmol/L): 5.8 mmol/L

## 2021-08-20 LAB — LIPID PANEL
Cholesterol: 147 mg/dL (ref <200)
HDL: 58 mg/dL (ref >=50)
LDL Cholesterol (Calc): 71 mg/dL (calc)
Non-HDL Cholesterol (Calc): 89 mg/dL (calc) (ref <130)
Total CHOL/HDL Ratio: 2.5 (calc) (ref <5.0)
Triglycerides: 92 mg/dL (ref <150)

## 2021-08-20 LAB — TSH: TSH: 0.86 mIU/L (ref 0.40–4.50)

## 2021-08-24 ENCOUNTER — Ambulatory Visit (INDEPENDENT_AMBULATORY_CARE_PROVIDER_SITE_OTHER): Payer: Medicare Other

## 2021-08-24 DIAGNOSIS — Z122 Encounter for screening for malignant neoplasm of respiratory organs: Secondary | ICD-10-CM | POA: Diagnosis not present

## 2021-08-24 DIAGNOSIS — Z87891 Personal history of nicotine dependence: Secondary | ICD-10-CM

## 2021-08-26 DIAGNOSIS — N281 Cyst of kidney, acquired: Secondary | ICD-10-CM | POA: Insufficient documentation

## 2021-08-26 NOTE — Addendum Note (Signed)
Addended by: Monica Becton on: 08/26/2021 09:43 AM ? ? Modules accepted: Orders ? ?

## 2021-08-26 NOTE — Assessment & Plan Note (Signed)
Incidentally noted left renal mass, ordering ultrasound. ?

## 2021-08-29 ENCOUNTER — Ambulatory Visit (INDEPENDENT_AMBULATORY_CARE_PROVIDER_SITE_OTHER): Payer: Medicare Other

## 2021-08-29 ENCOUNTER — Encounter: Payer: Self-pay | Admitting: Physical Therapy

## 2021-08-29 ENCOUNTER — Ambulatory Visit: Payer: Medicare Other | Attending: Sports Medicine | Admitting: Physical Therapy

## 2021-08-29 DIAGNOSIS — M25551 Pain in right hip: Secondary | ICD-10-CM | POA: Insufficient documentation

## 2021-08-29 DIAGNOSIS — M7061 Trochanteric bursitis, right hip: Secondary | ICD-10-CM | POA: Insufficient documentation

## 2021-08-29 DIAGNOSIS — H8111 Benign paroxysmal vertigo, right ear: Secondary | ICD-10-CM | POA: Insufficient documentation

## 2021-08-29 DIAGNOSIS — N281 Cyst of kidney, acquired: Secondary | ICD-10-CM | POA: Diagnosis not present

## 2021-08-29 DIAGNOSIS — M6281 Muscle weakness (generalized): Secondary | ICD-10-CM | POA: Diagnosis present

## 2021-08-29 DIAGNOSIS — R42 Dizziness and giddiness: Secondary | ICD-10-CM | POA: Diagnosis present

## 2021-08-29 NOTE — Therapy (Signed)
[facility] ?[address] [place] [address] ?[place], [place], [address] ?Phone: [phone]   Fax:  [phone] ? ?Physical Therapy Evaluation ? ?Patient Details  ?Name: Ann [name] ?MRN: [id number] ?Date of Birth: 10-Feb-1956 ?Referring Provider (PT): Monica Becton, MD ? ? ?Encounter Date: 08/29/2021 ? ? PT End of Session - 08/29/21 1124   ? ? Visit Number 1   ? Number of Visits 12   ? Date for PT Re-Evaluation 10/10/21   ? Authorization Type Medicare   ? PT Start Time 1015   ? PT Stop Time 1100   ? PT Time Calculation (min) 45 min   ? Activity Tolerance Patient tolerated treatment well   ? Behavior During Therapy [facility] for tasks assessed/performed   ? ?  ?  ? ?  ? ? ?Past Medical History:  ?Diagnosis Date  ? Alcoholism (HCC)   ? Degenerative disc disease   ? Hyperlipemia   ? Osteoporosis   ? ? ?Past Surgical History:  ?Procedure Laterality Date  ? CYSTOCELE REPAIR    ? TUBAL LIGATION    ? ? ?There were no vitals filed for this visit. ? ? ? Subjective Assessment - 08/29/21 1017   ? ? Subjective Pt states most pressing issue is her right hip. Pt reports it started 2-3 weeks ago. Pt has had it in the past in the left hip. Pt did therapy and injection and ended up having to get a total hip replacement. Pt states it came out of the blue. It states the last few days it's been better. If she walks/shops it gets really sore. Tender to palpation along her right hip. Pt does plan to have a trip at the end of May. She has also been referred for re-exacerbation of BPPV. States she is now only feeling it when she lays back fully in bed for a few seconds.   ? Limitations Standing   ? How long can you stand comfortably? Prior to 3 days ago needed a cane   ? How long can you walk comfortably? 1 room to the next a few days ago; now she can walk ~1 hour.   ? Patient Stated Goals Improve pain   ? Currently in Pain? No/denies   at worst 8/10  ? Pain Location Hip   ? Pain Orientation Right    ? Pain Descriptors / Indicators Aching;Radiating   ? Pain Radiating Towards Feels like it's going into the hip   ? Pain Onset 1 to 4 weeks ago   ? Pain Frequency Constant   ? Aggravating Factors  Walking, standing   ? Pain Relieving Factors Rest, sitting   ? ?  ?  ? ?  ? ? ? ? ? [facility] PT Assessment - 08/29/21 0001   ? ?  ? Assessment  ? Medical Diagnosis M70.61 (ICD-10-CM) - Trochanteric bursitis, right hip   ? Referring Provider (PT) Monica Becton, MD   ? Onset Date/Surgical Date --   3 weeks ago  ? Prior Therapy seen for 1 visit for BPPV   ?  ? Precautions  ? Precautions None   ?  ? Restrictions  ? Weight Bearing Restrictions No   ?  ? Balance Screen  ? Has the patient fallen in the past 6 months No   ?  ? Home Environment  ? Living Environment Private residence   ? Living Arrangements Spouse/significant other   ? Available Help at Discharge Family   ? Type of  Home House   ?  ? Prior Function  ? Vocation Retired   ?  ? Observation/Other Assessments  ? Focus on Therapeutic Outcomes (FOTO)  n/a   ?  ? ROM / Strength  ? AROM / PROM / Strength Strength   ?  ? Strength  ? Strength Assessment Site Hip   ? Right/Left Hip Right;Left   ? Right Hip Flexion 4-/5   ? Right Hip Extension 3+/5   ? Right Hip External Rotation  4-/5   ? Right Hip Internal Rotation 4/5   ? Right Hip ABduction 3/5   ? Right Hip ADduction 3/5   ? Left Hip Flexion 4/5   ? Left Hip Extension 4-/5   ? Left Hip External Rotation 4-/5   ? Left Hip Internal Rotation 4/5   ? Left Hip ABduction 3+/5   ? Left Hip ADduction 3+/5   ?  ? Flexibility  ? Soft Tissue Assessment /Muscle Length yes   hip flexor tightness  ? ITB Tight and tender   ?  ? Palpation  ? Palpation comment TTP along R greater trochanter, ITB and lateral quad   ?  ? Ambulation/Gait  ? Ambulation Distance (Feet) 80 Feet   ? Assistive device None   ? Gait Pattern Trendelenburg   trendelenburg over R  ? Ambulation Surface Level;Indoor   ? ?  ?  ? ?  ? ? ? ? ? ? ? ? ? Vestibular  Assessment - 08/29/21 0001   ? ?  ? Dix-Hallpike Right  ? Dix-Hallpike Right Duration ~15 sec   ? Dix-Hallpike Right Symptoms Upbeat, right rotatory nystagmus   ?  ? Dix-Hallpike Left  ? Dix-Hallpike Left Duration <10sec   mild symptoms <10 sec after ~5 sec latency  ? Dix-Hallpike Left Symptoms No nystagmus   ? ?  ?  ? ?  ? ? ? ? ? ?Objective measurements completed on examination: See above findings.  ? ? ? ? ? ? Vestibular Treatment/Exercise - 08/29/21 0001   ? ?  ? Vestibular Treatment/Exercise  ? Vestibular Treatment Provided Canalith Repositioning   ? Canalith Repositioning Epley Manuever Right   ?  ?  EPLEY MANUEVER RIGHT  ? Number of Reps  1   ? Overall Response Improved Symptoms   ? ?  ?  ? ?  ? ? ? ? ? ? ? ? ? PT Education - 08/29/21 1124   ? ? Education Details Discussed exam findings, POC and HEP   ? Person(s) Educated Patient   ? Methods Explanation;Demonstration;Tactile cues;Verbal cues;Handout   ? Comprehension Verbalized understanding;Returned demonstration;Verbal cues required;Tactile cues required   ? ?  ?  ? ?  ? ? ? ? ? ? PT Long Term Goals - 08/29/21 1128   ? ?  ? PT LONG TERM GOAL #1  ? Title Ind with checking and addressing BPPV at home   ? Time 3   ? Period Weeks   ? Status New   ? Target Date 09/19/21   ?  ? PT LONG TERM GOAL #2  ? Title (-) symptoms in all canalith positions to demo resolution of BPPV   ? Time 3   ? Period Weeks   ? Status New   ? Target Date 09/19/21   ?  ? PT LONG TERM GOAL #3  ? Title Pt will demo at least 4/5 bilat hip strength for improved hip stability with standing and walking   ? Time 6   ?  Period Weeks   ? Status New   ? Target Date 10/10/21   ?  ? PT LONG TERM GOAL #4  ? Title Pt will report decrease in hip pain by >/=75%   ? Time 6   ? Period Weeks   ? Status New   ? Target Date 10/10/21   ?  ? PT LONG TERM GOAL #5  ? Title Pt will be able to tolerate walking around the grocery store/in the community with pain levels </=2/10   ? Time 6   ? Period Weeks   ? Status  New   ? Target Date 10/10/21   ? ?  ?  ? ?  ? ? ? ? ? ? ? ? ? Plan - 08/29/21 1125   ? ? Clinical Impression Statement Ms. Flossie BuffyJenny Morioka returns to OPPT due to re-exacerbation of her BPPV and new onset of R hip pain. On assessment, pt is very tender to palpation along R hip greater trochanter and ITB characteristic of trochanteric bursitis with ITB syndrome. Pt demos gross bilat hip weakness (R weaker than L) likely contributing to her bursitis with limited hip flexion. Pt demos (+) R posterior canalithiasis this session addressed with Epley maneuver. Pt would benefit from PT to address these deficits for return to PLOF so she is safe with all community mobility.   ? Personal Factors and Comorbidities Age;Time since onset of injury/illness/exacerbation;Past/Current Experience   ? Examination-Activity Limitations Hygiene/Grooming;Locomotion Level;Bend   ? Examination-Participation Restrictions Cleaning;Community Activity;Meal Prep;Laundry   ? Stability/Clinical Decision Making Stable/Uncomplicated   ? Rehab Potential Good   ? PT Frequency 1x / week   ? PT Duration 6 weeks   ? PT Treatment/Interventions ADLs/Self Care Home Management;Therapeutic activities;Therapeutic exercise;Neuromuscular re-education;Balance training;Vestibular;Canalith Repostioning   ? PT Next Visit Plan Re-check canalith for BPPV. Continue hip/core/knee strengthening working on pelvic stability. Stretch and work on ITB.   ? PT Home Exercise Plan Access Code [email or web address]   ? Consulted and Agree with Plan of Care Patient   ? ?  ?  ? ?  ? ? ?Patient will benefit from skilled therapeutic intervention in order to improve the following deficits and impairments:  Dizziness, Decreased balance, Postural dysfunction, Decreased mobility ? ?Visit Diagnosis: ?Muscle weakness (generalized) ? ?Pain in right hip ? ?Dizziness and giddiness ? ?BPPV (benign paroxysmal positional vertigo), right ? ? ? ? ?Problem List ?Patient Active Problem List  ? Diagnosis Date Noted  ?  Renal cyst, left 08/26/2021  ? 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 Dyspepsia 07/27/2020  ? Anxiety and depression 11/04/2019  ? Urinary urgency 08/07/2017  ? Trochanteric bursit

## 2021-09-13 ENCOUNTER — Ambulatory Visit: Payer: Medicare Other | Admitting: Physical Therapy

## 2021-09-15 ENCOUNTER — Ambulatory Visit: Payer: Medicare Other | Admitting: Physical Therapy

## 2021-09-15 DIAGNOSIS — M25551 Pain in right hip: Secondary | ICD-10-CM

## 2021-09-15 DIAGNOSIS — M6281 Muscle weakness (generalized): Secondary | ICD-10-CM | POA: Diagnosis not present

## 2021-09-15 DIAGNOSIS — H8111 Benign paroxysmal vertigo, right ear: Secondary | ICD-10-CM

## 2021-09-15 DIAGNOSIS — R42 Dizziness and giddiness: Secondary | ICD-10-CM

## 2021-09-15 NOTE — Therapy (Addendum)
[address] [place], [place], [address] Phone: [phone]   Fax:  [phone]  Physical Therapy Treatment and Discharge  Patient Details  Name: Ann [name] MRN: [id number] Date of Birth: 05-17-1955 Referring Provider (PT): Silverio Decamp, MD  PHYSICAL THERAPY DISCHARGE SUMMARY  Visits from Start of Care: 2  Current functional level related to goals / functional outcomes: See below   Remaining deficits: See below   Education / Equipment: See below   Patient agrees to discharge. Patient goals were partially met. Patient is being discharged due to being pleased with the current functional level.   Encounter Date: 09/15/2021   PT End of Session - 09/15/21 1527     Visit Number 2    Number of Visits 12    Date for PT Re-Evaluation 10/10/21    Authorization Type Medicare    Activity Tolerance Patient tolerated treatment well    Behavior During Therapy [facility] for tasks assessed/performed             Past Medical History:  Diagnosis Date   Alcoholism ([place])    Degenerative disc disease    Hyperlipemia    Osteoporosis     Past Surgical History:  Procedure Laterality Date   CYSTOCELE REPAIR     TUBAL LIGATION      There were no vitals filed for this visit.   Subjective Assessment - 09/15/21 1527     Subjective Pt states she is getting ready for her annual road trip cross country. Pt states hip and dizziness has improved. Pt reports it is tender when palpating but not hurting with walking.    Limitations Standing    How long can you stand comfortably? Prior to 3 days ago needed a cane    How long can you walk comfortably? 1 room to the next a few days ago; now she can walk ~1 hour.    Patient Stated Goals Improve pain    Currently in Pain? No/denies    Pain Onset 1 to 4 weeks ago                     Vestibular Assessment - 09/15/21 0001       Dix-Hallpike Right    Dix-Hallpike Right Duration <5 sec    Dix-Hallpike Right Symptoms No nystagmus   "very very light"     Dix-Hallpike Left   Dix-Hallpike Left Duration <5 sec    Dix-Hallpike Left Symptoms No nystagmus   "very very slight"                     [facility] Adult PT Treatment/Exercise - 09/15/21 0001       Exercises   Exercises Knee/Hip      Knee/Hip Exercises: Stretches   Hip Flexor Stretch Right;30 seconds    ITB Stretch 20 seconds    Piriformis Stretch 20 seconds    Piriformis Stretch Limitations sitting    Other Knee/Hip Stretches figure 4 stretch x30 sec in sup and then in sitting    Other Knee/Hip Stretches trialed TFL stretch in sidelying and standing but pt did not get much of a stretch      Knee/Hip Exercises: Standing   Hip Abduction Stengthening;10 reps;Right;Left    Hip Extension Stengthening;Right;Left;10 reps    Other Standing Knee Exercises modified deadlift with stool 5# x10    Other Standing Knee Exercises trialed mini squats x5 but too much pain in pt's knees  PT Education - 09/15/21 1715     Education Details Provided pt with how to check and fix posterior BPPV at home    Person(s) Educated Patient    Methods Explanation;Demonstration;Tactile cues;Verbal cues;Handout    Comprehension Verbalized understanding;Returned demonstration;Verbal cues required;Tactile cues required;Need further instruction                 PT Long Term Goals - 09/15/21 1714       PT LONG TERM GOAL #1   Title Ind with checking and addressing BPPV at home    Time 3    Period Weeks    Status Partially Met    Target Date 09/19/21      PT LONG TERM GOAL #2   Title (-) symptoms in all canalith positions to demo resolution of BPPV    Baseline mild symptoms but no nystagmus    Time 3    Period Weeks    Status Partially Met    Target Date 09/19/21      PT LONG TERM GOAL #3   Title Pt will demo at least 4/5 bilat hip strength for improved  hip stability with standing and walking    Baseline now tolerating red tband vs yellow    Time 6    Period Weeks    Status Partially Met    Target Date 10/10/21      PT LONG TERM GOAL #4   Title Pt will report decrease in hip pain by >/=75%    Time 6    Period Weeks    Status Achieved    Target Date 10/10/21      PT LONG TERM GOAL #5   Title Pt will be able to tolerate walking around the grocery store/in the community with pain levels </=2/10    Baseline Reports no pain with walking    Time 6    Period Weeks    Status Achieved    Target Date 10/10/21                   Plan - 09/15/21 1711     Clinical Impression Statement Pt returns with much improved dizziness and R hip pain. Pt did not demo any nystagmus with canalith testing; however, did feel a few mild symptoms. Performed Epley maneuver bilat. In regards to R hip, it is still tender with palpation but she is now able to amb more normally. Continued to work on glute strengthening and stretching hip. Pt is to be gone for 4-5 weeks for her annual cross country road trip vacation. Discussed for her to call clinic as needed to schedule if any exacerbation. Otherwise, may d/c her chart at end of June if she does not call back.    Personal Factors and Comorbidities Age;Time since onset of injury/illness/exacerbation;Past/Current Experience    Examination-Activity Limitations Hygiene/Grooming;Locomotion Level;Bend    Examination-Participation Restrictions Cleaning;Community Activity;Meal Prep;Laundry    Stability/Clinical Decision Making Stable/Uncomplicated    Rehab Potential Good    PT Frequency 1x / week    PT Duration 6 weeks    PT Treatment/Interventions ADLs/Self Care Home Management;Therapeutic activities;Therapeutic exercise;Neuromuscular re-education;Balance training;Vestibular;Canalith Repostioning    PT Next Visit Plan Re-check canalith for BPPV. Continue hip/core/knee strengthening working on pelvic stability.  Stretch and work on ITB.    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 09/22/2020   Dyspepsia 07/27/2020   Anxiety and depression 11/04/2019   Urinary urgency 08/07/2017   Trochanteric bursitis, right hip 06/01/2016   Primary osteoarthritis of right knee 06/01/2016   Abdominal obesity and metabolic syndrome 49/75/3005   Restless leg syndrome 01/21/2016   Annual physical exam 11/12/2015   DDD (degenerative disc disease), cervical 11/12/2015   Benign paroxysmal positional vertigo 09/22/2015   Chronic bronchitis ([place]) 12/30/2014   Pulmonary nodule 09/19/2013   Hyperlipidemia 09/12/2013   Former smoker 09/12/2013   Lumbar degenerative disc disease 08/28/2012    [facility] April Gordy Levan, PT, DPT 09/15/2021, 5:16 PM  [facility] [place], [place], [address] Phone: [phone]   Fax:  [phone]  Name: Ann [name] MRN: [id number] Date of Birth: [birth year]

## 2021-09-20 ENCOUNTER — Ambulatory Visit: Payer: Medicare Other | Admitting: Physical Therapy

## 2021-11-10 ENCOUNTER — Other Ambulatory Visit: Payer: Self-pay | Admitting: Sports Medicine

## 2021-11-10 DIAGNOSIS — E785 Hyperlipidemia, unspecified: Secondary | ICD-10-CM

## 2021-11-22 ENCOUNTER — Encounter: Payer: Self-pay | Admitting: Sports Medicine

## 2021-11-22 DIAGNOSIS — E8881 Metabolic syndrome: Secondary | ICD-10-CM

## 2021-11-22 DIAGNOSIS — E669 Obesity, unspecified: Secondary | ICD-10-CM

## 2021-11-23 MED ORDER — MOUNJARO 12.5 MG/0.5ML ~~LOC~~ SOAJ: 12.5000 mg | SUBCUTANEOUS | 11 refills | Status: DC

## 2021-11-30 ENCOUNTER — Encounter (INDEPENDENT_AMBULATORY_CARE_PROVIDER_SITE_OTHER): Payer: Self-pay

## 2021-12-14 ENCOUNTER — Encounter: Payer: Self-pay | Admitting: General Practice

## 2022-01-03 ENCOUNTER — Other Ambulatory Visit: Payer: Self-pay

## 2022-01-03 DIAGNOSIS — E8881 Metabolic syndrome: Secondary | ICD-10-CM

## 2022-01-03 MED ORDER — MOUNJARO 12.5 MG/0.5ML ~~LOC~~ SOAJ: 12.5000 mg | SUBCUTANEOUS | 3 refills | Status: DC

## 2022-01-19 ENCOUNTER — Ambulatory Visit (INDEPENDENT_AMBULATORY_CARE_PROVIDER_SITE_OTHER): Payer: Medicare Other | Admitting: Family Medicine

## 2022-01-19 DIAGNOSIS — Z23 Encounter for immunization: Secondary | ICD-10-CM

## 2022-01-19 NOTE — Addendum Note (Signed)
Addended by: Tarri Glenn A on: 01/19/2022 04:16 PM   Modules accepted: Orders

## 2022-01-26 ENCOUNTER — Telehealth: Payer: Medicare Other | Admitting: Physician Assistant

## 2022-01-26 DIAGNOSIS — T7840XA Allergy, unspecified, initial encounter: Secondary | ICD-10-CM | POA: Diagnosis not present

## 2022-01-26 DIAGNOSIS — R21 Rash and other nonspecific skin eruption: Secondary | ICD-10-CM

## 2022-01-26 MED ORDER — TRIAMCINOLONE ACETONIDE 0.1 % EX CREA: 1.0000 | TOPICAL_CREAM | Freq: Three times a day (TID) | CUTANEOUS | 0 refills | Status: DC

## 2022-01-26 NOTE — Progress Notes (Signed)
E Visit for Rash  We are sorry that you are not feeling well. Here is how we plan to help!  Based on what you shared with me you may have a virus or an allergic reaction.  Avoid contact with pregnant women until a diagnosis is made.  Most viral rashes are contagious (especially if a fever is present).  You can return to work or school after the rash is gone or when your doctor says it is safe to return with the rash.   I recommend you take Benadryl 25 mg - 50 mg every 4 hours to control the symptoms but if they last over 24 hours it is best that you see an office based provider for follow up. This can be taken with the Fexofenadine you are already taking. I will also prescribe topical triamcinolone cream to apply topically 2-3 times daily for itching as well.   Cool compresses over area a few times daily. Take luke warm to cool showers as heat will activate the itch response over the area and can make itching worse.    HOME CARE:  Take cool showers and avoid direct sunlight. Apply cool compress or wet dressings. Take a bath in an oatmeal bath.  Sprinkle content of one Aveeno packet under running faucet with comfortably warm water.  Bathe for 15-20 minutes, 1-2 times daily.  Pat dry with a towel. Do not rub the rash. Use hydrocortisone cream. Take an antihistamine like Benadryl for widespread rashes that itch.  The adult dose of Benadryl is 25-50 mg by mouth 4 times daily. Caution:  This type of medication may cause sleepiness.  Do not drink alcohol, drive, or operate dangerous machinery while taking antihistamines.  Do not take these medications if you have prostate enlargement.  Read package instructions thoroughly on all medications that you take.  GET HELP RIGHT AWAY IF:  Symptoms don't go away after treatment. Severe itching that persists. If you rash spreads or swells. If you rash begins to smell. If it blisters and opens or develops a yellow-brown crust. You develop a fever. You  have a sore throat. You become short of breath.  MAKE SURE YOU:  Understand these instructions. Will watch your condition. Will get help right away if you are not doing well or get worse.  Thank you for choosing an e-visit.  Your e-visit answers were reviewed by a board certified advanced clinical practitioner to complete your personal care plan. Depending upon the condition, your plan could have included both over the counter or prescription medications.  Please review your pharmacy choice. Make sure the pharmacy is open so you can pick up prescription now. If there is a problem, you may contact your provider through [employer] and have the prescription routed to another pharmacy.  Your safety is important to [place]. If you have drug allergies check your prescription carefully.   For the next 24 hours you can use MyChart to ask questions about today's visit, request a non-urgent call back, or ask for a work or school excuse. You will get an email in the next two days asking about your experience. I hope that your e-visit has been valuable and will speed your recovery.  I provided 5 minutes of non face-to-face time during this encounter for chart review and documentation.

## 2022-04-11 ENCOUNTER — Other Ambulatory Visit: Payer: Self-pay | Admitting: Sports Medicine

## 2022-04-11 DIAGNOSIS — R1013 Epigastric pain: Secondary | ICD-10-CM

## 2022-07-09 ENCOUNTER — Other Ambulatory Visit: Payer: Self-pay | Admitting: Sports Medicine

## 2022-07-09 DIAGNOSIS — R1013 Epigastric pain: Secondary | ICD-10-CM

## 2022-07-09 DIAGNOSIS — F419 Anxiety disorder, unspecified: Secondary | ICD-10-CM

## 2022-08-15 ENCOUNTER — Telehealth: Payer: Self-pay | Admitting: Sports Medicine

## 2022-08-15 NOTE — Telephone Encounter (Signed)
Called patient to schedule Medicare Annual Wellness Visit (AWV). Left message for patient to call back and schedule Medicare Annual Wellness Visit (AWV).  Last date of AWV: Never  Please schedule an appointment at any time with [facility].  If any questions, please contact me at [phone].  Thank you ,  Morgan Jessup Patient Access Advocate II Direct Dial: [phone]  

## 2022-08-18 ENCOUNTER — Other Ambulatory Visit: Payer: Self-pay | Admitting: Sports Medicine

## 2022-08-18 DIAGNOSIS — Z1231 Encounter for screening mammogram for malignant neoplasm of breast: Secondary | ICD-10-CM

## 2022-08-22 ENCOUNTER — Ambulatory Visit (HOSPITAL_BASED_OUTPATIENT_CLINIC_OR_DEPARTMENT_OTHER)
Admission: RE | Admit: 2022-08-22 | Discharge: 2022-08-22 | Disposition: A | Discharge status: Home / Self Care | Payer: Medicare Other | Source: Ambulatory Visit | Attending: Sports Medicine | Admitting: Sports Medicine

## 2022-08-22 ENCOUNTER — Encounter (HOSPITAL_BASED_OUTPATIENT_CLINIC_OR_DEPARTMENT_OTHER): Payer: Self-pay

## 2022-08-22 DIAGNOSIS — Z1231 Encounter for screening mammogram for malignant neoplasm of breast: Secondary | ICD-10-CM | POA: Diagnosis not present

## 2022-08-24 ENCOUNTER — Encounter: Payer: Self-pay | Admitting: Sports Medicine

## 2022-08-24 ENCOUNTER — Ambulatory Visit (INDEPENDENT_AMBULATORY_CARE_PROVIDER_SITE_OTHER): Payer: Medicare Other | Admitting: Sports Medicine

## 2022-08-24 VITALS — BP 98/64 | HR 77 | Ht 62.75 in | Wt 127.0 lb

## 2022-08-24 DIAGNOSIS — L219 Seborrheic dermatitis, unspecified: Secondary | ICD-10-CM

## 2022-08-24 DIAGNOSIS — E538 Deficiency of other specified B group vitamins: Secondary | ICD-10-CM

## 2022-08-24 DIAGNOSIS — R739 Hyperglycemia, unspecified: Secondary | ICD-10-CM

## 2022-08-24 DIAGNOSIS — Z823 Family history of stroke: Secondary | ICD-10-CM

## 2022-08-24 DIAGNOSIS — Z0000 Encounter for general adult medical examination without abnormal findings: Secondary | ICD-10-CM

## 2022-08-24 DIAGNOSIS — E669 Obesity, unspecified: Secondary | ICD-10-CM

## 2022-08-24 DIAGNOSIS — F32A Depression, unspecified: Secondary | ICD-10-CM

## 2022-08-24 DIAGNOSIS — R1013 Epigastric pain: Secondary | ICD-10-CM | POA: Diagnosis not present

## 2022-08-24 DIAGNOSIS — E785 Hyperlipidemia, unspecified: Secondary | ICD-10-CM | POA: Diagnosis not present

## 2022-08-24 DIAGNOSIS — F419 Anxiety disorder, unspecified: Secondary | ICD-10-CM | POA: Diagnosis not present

## 2022-08-24 DIAGNOSIS — E559 Vitamin D deficiency, unspecified: Secondary | ICD-10-CM

## 2022-08-24 DIAGNOSIS — K1379 Other lesions of oral mucosa: Secondary | ICD-10-CM | POA: Insufficient documentation

## 2022-08-24 DIAGNOSIS — E8881 Metabolic syndrome: Secondary | ICD-10-CM | POA: Diagnosis not present

## 2022-08-24 DIAGNOSIS — Z87891 Personal history of nicotine dependence: Secondary | ICD-10-CM

## 2022-08-24 MED ORDER — ESCITALOPRAM OXALATE 5 MG PO TABS: 5.0000 mg | ORAL_TABLET | Freq: Every day | ORAL | 3 refills | Status: DC

## 2022-08-24 MED ORDER — MOUNJARO 12.5 MG/0.5ML ~~LOC~~ SOAJ: 12.5000 mg | SUBCUTANEOUS | 3 refills | Status: DC

## 2022-08-24 MED ORDER — ROSUVASTATIN CALCIUM 40 MG PO TABS: 40.0000 mg | ORAL_TABLET | Freq: Every day | ORAL | 3 refills | Status: DC

## 2022-08-24 MED ORDER — CLOBETASOL PROPIONATE 0.05 % EX OINT
1.0000 | TOPICAL_OINTMENT | Freq: Two times a day (BID) | CUTANEOUS | 3 refills | Status: DC
Start: 2022-08-24 — End: 2023-12-21

## 2022-08-24 MED ORDER — PANTOPRAZOLE SODIUM 40 MG PO TBEC
40.0000 mg | DELAYED_RELEASE_TABLET | Freq: Two times a day (BID) | ORAL | 3 refills | Status: DC
Start: 2022-08-24 — End: 2023-12-21

## 2022-08-24 NOTE — Assessment & Plan Note (Signed)
Annual physical as above, up-to-date on colon and breast cancer screening, adding lung cancer screening. She would like some carotid Dopplers as she does have a strong family history of CVA. Checking routine labs.

## 2022-08-24 NOTE — Progress Notes (Signed)
Subjective:    CC: Annual Physical Exam  HPI:  This patient is here for their annual physical  I reviewed the past medical history, family history, social history, surgical history, and allergies today and no changes were needed.  Please see the problem list section below in epic for further details.  Past Medical History: Past Medical History:  Diagnosis Date   Alcoholism (HCC)    Degenerative disc disease    Hyperlipemia    Osteoporosis    Past Surgical History: Past Surgical History:  Procedure Laterality Date   CYSTOCELE REPAIR     TUBAL LIGATION     Social History: Social History   Socioeconomic History   Marital status: Married    Spouse name: Not on file   Number of children: Not on file   Years of education: Not on file   Highest education level: Not on file  Occupational History   Not on file  Tobacco Use   Smoking status: Former    Packs/day: 0.50    Years: 40.00    Additional pack years: 0.00    Total pack years: 20.00    Types: Cigarettes   Smokeless tobacco: Never  Vaping Use   Vaping Use: Never used  Substance and Sexual Activity   Alcohol use: Not Currently    Alcohol/week: 70.0 standard drinks of alcohol    Types: 70 Cans of beer per week   Drug use: No   Sexual activity: Not on file  Other Topics Concern   Not on file  Social History Narrative   Not on file   Social Determinants of Health   Financial Resource Strain: Not on file  Food Insecurity: Not on file  Transportation Needs: Not on file  Physical Activity: Not on file  Stress: Not on file  Social Connections: Not on file   Family History: Family History  Problem Relation Age of Onset   Cancer Mother        breast   Alcohol abuse Mother    Cancer Father        prostate   Alcohol abuse Paternal Uncle    Alcohol abuse Maternal Grandfather    Alcohol abuse Paternal Grandfather    Allergies: Allergies  Allergen Reactions   Naproxen Anaphylaxis, Other (See Comments) and  Swelling    Throat Red in the face within seconds    Desyrel [Trazodone] Other (See Comments)    Nightmares and sweats   Medications: See med rec.  Review of Systems: No headache, visual changes, nausea, vomiting, diarrhea, constipation, dizziness, abdominal pain, skin rash, fevers, chills, night sweats, swollen lymph nodes, weight loss, chest pain, body aches, joint swelling, muscle aches, shortness of breath, mood changes, visual or auditory hallucinations.  Objective:    General: Well Developed, well nourished, and in no acute distress.  Neuro: Alert and oriented x3, extra-ocular muscles intact, sensation grossly intact. Cranial nerves II through XII are intact, motor, sensory, and coordinative functions are all intact. HEENT: Normocephalic, atraumatic, pupils equal round reactive to light, neck supple, no masses, no lymphadenopathy, thyroid nonpalpable. Oropharynx, nasopharynx, external ear canals are unremarkable.  There does appear to be a mucosal lesion left soft palate, question mucocele. Skin: Warm and dry, no rashes noted.  Cardiac: Regular rate and rhythm, no murmurs rubs or gallops.  Respiratory: Clear to auscultation bilaterally. Not using accessory muscles, speaking in full sentences.  Abdominal: Soft, nontender, nondistended, positive bowel sounds, no masses, no organomegaly.  Musculoskeletal: Shoulder, elbow, wrist, hip, knee, ankle  stable, and with full range of motion.  Impression and Recommendations:    The patient was counselled, risk factors were discussed, anticipatory guidance given.  Annual physical exam Annual physical as above, up-to-date on colon and breast cancer screening, adding lung cancer screening. She would like some carotid Dopplers as she does have a strong family history of CVA. Checking routine labs.  Lesion of soft palate There does appear to be mucosal lesion soft palate on the left, question mucocele but I am not sure, I would like ENT to weigh  in.   ____________________________________________ Ihor Austin. Benjamin Stain, M.D., ABFM., CAQSM., AME. Primary Care and Sports Medicine [facility]  Adjunct Professor of Family Medicine  [employer]  Restaurant manager, fast food

## 2022-08-24 NOTE — Assessment & Plan Note (Signed)
There does appear to be mucosal lesion soft palate on the left, question mucocele but I am not sure, I would like ENT to weigh in.

## 2022-08-25 LAB — COMPREHENSIVE METABOLIC PANEL
AG Ratio: 1.7 (calc) (ref 1.0–2.5)
ALT: 27 U/L (ref 6–29)
AST: 24 U/L (ref 10–35)
Albumin: 4.2 g/dL (ref 3.6–5.1)
Alkaline phosphatase (APISO): 36 U/L — ABNORMAL LOW (ref 37–153)
BUN: 13 mg/dL (ref 7–25)
CO2: 28 mmol/L (ref 20–32)
Calcium: 9.5 mg/dL (ref 8.6–10.4)
Chloride: 104 mmol/L (ref 98–110)
Creat: 0.98 mg/dL (ref 0.50–1.05)
Globulin: 2.5 g/dL (calc) (ref 1.9–3.7)
Glucose, Bld: 88 mg/dL (ref 65–99)
Potassium: 5.3 mmol/L (ref 3.5–5.3)
Sodium: 139 mmol/L (ref 135–146)
Total Bilirubin: 0.4 mg/dL (ref 0.2–1.2)
Total Protein: 6.7 g/dL (ref 6.1–8.1)

## 2022-08-25 LAB — LIPID PANEL
Cholesterol: 196 mg/dL (ref <200)
HDL: 86 mg/dL (ref >=50)
LDL Cholesterol (Calc): 93 mg/dL (calc)
Non-HDL Cholesterol (Calc): 110 mg/dL (calc) (ref <130)
Total CHOL/HDL Ratio: 2.3 (calc) (ref <5.0)
Triglycerides: 84 mg/dL (ref <150)

## 2022-08-25 LAB — CBC
HCT: 39.6 % (ref 35.0–45.0)
Hemoglobin: 13 g/dL (ref 11.7–15.5)
MCH: 30.9 pg (ref 27.0–33.0)
MCHC: 32.8 g/dL (ref 32.0–36.0)
MCV: 94.1 fL (ref 80.0–100.0)
MPV: 10.3 fL (ref 7.5–12.5)
Platelets: 274 10*3/uL (ref 140–400)
RBC: 4.21 10*6/uL (ref 3.80–5.10)
RDW: 12.4 % (ref 11.0–15.0)
WBC: 5.5 10*3/uL (ref 3.8–10.8)

## 2022-08-25 LAB — VITAMIN B12: Vitamin B-12: 383 pg/mL (ref 200–1100)

## 2022-08-25 LAB — VITAMIN D 25 HYDROXY (VIT D DEFICIENCY, FRACTURES): Vit D, 25-Hydroxy: 42 ng/mL (ref 30–100)

## 2022-08-25 LAB — HEMOGLOBIN A1C
Hgb A1c MFr Bld: 5.3 % of total Hgb (ref <5.7)
Mean Plasma Glucose: 105 mg/dL
eAG (mmol/L): 5.8 mmol/L

## 2022-08-25 LAB — TSH: TSH: 1.01 mIU/L (ref 0.40–4.50)

## 2022-08-29 ENCOUNTER — Ambulatory Visit (INDEPENDENT_AMBULATORY_CARE_PROVIDER_SITE_OTHER): Payer: Medicare Other

## 2022-08-29 DIAGNOSIS — Z87891 Personal history of nicotine dependence: Secondary | ICD-10-CM

## 2022-09-08 ENCOUNTER — Other Ambulatory Visit (HOSPITAL_COMMUNITY): Payer: Self-pay | Admitting: Sports Medicine

## 2022-09-08 ENCOUNTER — Ambulatory Visit (HOSPITAL_COMMUNITY)
Admission: RE | Admit: 2022-09-08 | Discharge: 2022-09-08 | Disposition: A | Discharge status: Home / Self Care | Payer: Medicare Other | Source: Ambulatory Visit | Attending: Vascular Surgery | Admitting: Vascular Surgery

## 2022-09-08 DIAGNOSIS — Z8249 Family history of ischemic heart disease and other diseases of the circulatory system: Secondary | ICD-10-CM

## 2022-10-10 ENCOUNTER — Encounter (HOSPITAL_COMMUNITY): Payer: Self-pay | Admitting: Cardiology

## 2023-03-04 ENCOUNTER — Other Ambulatory Visit: Payer: Self-pay | Admitting: Sports Medicine

## 2023-03-04 DIAGNOSIS — E8881 Metabolic syndrome: Secondary | ICD-10-CM

## 2023-09-05 ENCOUNTER — Encounter: Payer: Self-pay | Admitting: Sports Medicine

## 2023-09-05 DIAGNOSIS — E785 Hyperlipidemia, unspecified: Secondary | ICD-10-CM

## 2023-09-05 MED ORDER — ROSUVASTATIN CALCIUM 40 MG PO TABS
40.0000 mg | ORAL_TABLET | Freq: Every day | ORAL | 0 refills | Status: DC
Start: 2023-09-05 — End: 2023-12-21

## 2023-10-24 ENCOUNTER — Other Ambulatory Visit: Payer: Self-pay | Admitting: Sports Medicine

## 2023-10-24 DIAGNOSIS — R1013 Epigastric pain: Secondary | ICD-10-CM

## 2023-10-24 DIAGNOSIS — F32A Depression, unspecified: Secondary | ICD-10-CM

## 2023-10-29 NOTE — Telephone Encounter (Signed)
Attempted to contact pt, Vm is full

## 2023-11-12 NOTE — Telephone Encounter (Signed)
 Mailbox if still full; unable to leave a message.

## 2023-12-20 ENCOUNTER — Ambulatory Visit: Admitting: Sports Medicine

## 2023-12-21 ENCOUNTER — Ambulatory Visit (INDEPENDENT_AMBULATORY_CARE_PROVIDER_SITE_OTHER): Admitting: Urgent Care

## 2023-12-21 ENCOUNTER — Encounter: Payer: Self-pay | Admitting: Urgent Care

## 2023-12-21 VITALS — BP 114/76 | HR 76 | Resp 20 | Ht 62.75 in | Wt 130.5 lb

## 2023-12-21 DIAGNOSIS — F32A Depression, unspecified: Secondary | ICD-10-CM

## 2023-12-21 DIAGNOSIS — F419 Anxiety disorder, unspecified: Secondary | ICD-10-CM | POA: Diagnosis not present

## 2023-12-21 DIAGNOSIS — Z1211 Encounter for screening for malignant neoplasm of colon: Secondary | ICD-10-CM

## 2023-12-21 DIAGNOSIS — E538 Deficiency of other specified B group vitamins: Secondary | ICD-10-CM | POA: Diagnosis not present

## 2023-12-21 DIAGNOSIS — R1013 Epigastric pain: Secondary | ICD-10-CM | POA: Diagnosis not present

## 2023-12-21 DIAGNOSIS — Z87891 Personal history of nicotine dependence: Secondary | ICD-10-CM

## 2023-12-21 DIAGNOSIS — R635 Abnormal weight gain: Secondary | ICD-10-CM

## 2023-12-21 DIAGNOSIS — R911 Solitary pulmonary nodule: Secondary | ICD-10-CM

## 2023-12-21 DIAGNOSIS — E785 Hyperlipidemia, unspecified: Secondary | ICD-10-CM

## 2023-12-21 DIAGNOSIS — E8881 Metabolic syndrome: Secondary | ICD-10-CM

## 2023-12-21 DIAGNOSIS — E559 Vitamin D deficiency, unspecified: Secondary | ICD-10-CM

## 2023-12-21 DIAGNOSIS — R739 Hyperglycemia, unspecified: Secondary | ICD-10-CM

## 2023-12-21 DIAGNOSIS — H6993 Unspecified Eustachian tube disorder, bilateral: Secondary | ICD-10-CM

## 2023-12-21 MED ORDER — PANTOPRAZOLE SODIUM 40 MG PO TBEC
40.0000 mg | DELAYED_RELEASE_TABLET | Freq: Every day | ORAL | 3 refills | Status: AC
Start: 2023-12-21 — End: ?

## 2023-12-21 MED ORDER — PANTOPRAZOLE SODIUM 40 MG PO TBEC: 40.0000 mg | DELAYED_RELEASE_TABLET | Freq: Two times a day (BID) | ORAL | 3 refills | Status: DC

## 2023-12-21 MED ORDER — ESCITALOPRAM OXALATE 5 MG PO TABS
5.0000 mg | ORAL_TABLET | Freq: Every day | ORAL | 3 refills | Status: DC
Start: 2023-12-21 — End: 2023-12-21

## 2023-12-21 MED ORDER — ROSUVASTATIN CALCIUM 40 MG PO TABS: 40.0000 mg | ORAL_TABLET | Freq: Every day | ORAL | 0 refills | Status: DC

## 2023-12-21 MED ORDER — MOUNJARO 12.5 MG/0.5ML ~~LOC~~ SOAJ: 12.5000 mg | SUBCUTANEOUS | 3 refills | Status: DC

## 2023-12-21 MED ORDER — ROSUVASTATIN CALCIUM 40 MG PO TABS: 40.0000 mg | ORAL_TABLET | Freq: Every day | ORAL | 3 refills | Status: AC

## 2023-12-21 MED ORDER — ESCITALOPRAM OXALATE 5 MG PO TABS: 5.0000 mg | ORAL_TABLET | Freq: Every day | ORAL | 3 refills | Status: AC

## 2023-12-21 NOTE — Patient Instructions (Addendum)
 Please continue all your home medications as ordered. Please complete the cologuard test. You can update your CT scan in [address].  Please return for annual physical in 6 months.

## 2023-12-21 NOTE — Progress Notes (Signed)
 Established Patient Office Visit  Subjective:  Patient ID: Ann [name], female    DOB: 1955-12-19  Age: 68 y.o. MRN: [id number]  Chief Complaint  Patient presents with   Follow-up    HPI  Discussed the use of AI scribe software for clinical note transcription with the patient, who gave verbal consent to proceed.  History of Present Illness   Ann [name] is a 68 year old female who presents for a yearly medication refill and blood draw for cholesterol.  She is currently taking Lexapro , a multivitamin, pantoprazole  40 mg once daily, rosuvastatin  40 mg, and tirzepatide  12.5 mg weekly. She has discontinued the use of clobetasol .  She has been using tirzepatide  for a couple of years for prediabetes and weight management, having lost 50 pounds. She is currently using tirzepatide  for weight maintenance.  She has been on Lexapro  for three to five years and it is working well for her.  She takes pantoprazole  for heartburn and reports a lot of 'junk coming up in my throat'. She has seen an allergist who could not determine the cause. She experiences ear plugging when talking or singing, which has been investigated with a tube placement and other tests without a clear cause identified. She suspects possible mold allergy in her home. She is a former smoker, having quit four years ago, and has noticed increased coughing over the last two to three months.  She underwent a Cologuard test three years ago and is due for a colonoscopy. She has a mammogram scheduled for April next year. She is also undergoing lung cancer screening with a CT scan.  She was told she has a history of B12 deficiency, but she was unaware of this and has not been treated for it.       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 09/22/2020   Dyspepsia 07/27/2020   Anxiety and depression 11/04/2019    Urinary urgency 08/07/2017   Trochanteric bursitis, right hip 06/01/2016   Primary osteoarthritis of right knee 06/01/2016   Abdominal obesity and metabolic syndrome 06/01/2016   Restless leg syndrome 01/21/2016   Annual physical exam 11/12/2015   DDD (degenerative disc disease), cervical 11/12/2015   Benign paroxysmal positional vertigo 09/22/2015   Chronic bronchitis (HCC) 12/30/2014   Pulmonary nodule 09/19/2013   Hyperlipidemia 09/12/2013   Former smoker 09/12/2013   Lumbar degenerative disc disease 08/28/2012   Past Medical History:  Diagnosis Date   Alcoholism (HCC)    Allergy Pollen, cat dander   Anxiety    Arthritis    COPD (chronic obstructive pulmonary disease) (HCC)    Degenerative disc disease    GERD (gastroesophageal reflux disease)    Hyperlipemia    Osteoporosis    Past Surgical History:  Procedure Laterality Date   CYSTOCELE REPAIR     HERNIA REPAIR  May 2012   Cystocele repair   JOINT REPLACEMENT  May 2017   Total, left hip   TUBAL LIGATION     Social History   Tobacco Use   Smoking status: Former    Current packs/day: 0.50    Average packs/day: 0.5 packs/day for 40.0 years (20.0 ttl pk-yrs)    Types: Cigarettes   Smokeless tobacco: Never  Vaping Use   Vaping status: Never Used  Substance Use Topics   Alcohol use: Not Currently    Alcohol/week: 70.0  standard drinks of alcohol    Types: 70 Cans of beer per week   Drug use: No      ROS: as noted in HPI  Objective:     BP 114/76   Pulse 76   Resp 20   Ht 5' 2.75 (1.594 m)   Wt 130 lb 8 oz (59.2 kg)   SpO2 98%   BMI 23.30 kg/m  BP Readings from Last 3 Encounters:  12/21/23 114/76  08/24/22 98/64  08/19/21 101/68   Wt Readings from Last 3 Encounters:  12/21/23 130 lb 8 oz (59.2 kg)  08/24/22 127 lb (57.6 kg)  08/19/21 151 lb (68.5 kg)      Physical Exam Vitals and nursing note reviewed.  Constitutional:      General: She is not in acute distress.    Appearance: Normal  appearance. She is not ill-appearing, toxic-appearing or diaphoretic.  HENT:     Head: Normocephalic and atraumatic.     Right Ear: External ear normal. Tympanic membrane is scarred. Tympanic membrane is not injected, perforated or erythematous.     Left Ear: External ear normal. Tympanic membrane is scarred and perforated (chronic). Tympanic membrane is not injected or erythematous.     Nose: Congestion present.     Right Sinus: No maxillary sinus tenderness or frontal sinus tenderness.     Left Sinus: No maxillary sinus tenderness or frontal sinus tenderness.     Mouth/Throat:     Lips: Pink.     Mouth: Mucous membranes are moist.     Pharynx: Oropharynx is clear. Uvula midline. No pharyngeal swelling, oropharyngeal exudate or posterior oropharyngeal erythema.  Eyes:     General: No scleral icterus.       Right eye: No discharge.        Left eye: No discharge.     Extraocular Movements: Extraocular movements intact.     Pupils: Pupils are equal, round, and reactive to light.  Neck:     Thyroid: No thyroid mass, thyromegaly or thyroid tenderness.  Cardiovascular:     Rate and Rhythm: Normal rate and regular rhythm.     Pulses: Normal pulses.     Heart sounds: No murmur heard. Pulmonary:     Effort: Pulmonary effort is normal. No respiratory distress.     Breath sounds: Normal breath sounds. No stridor. No wheezing or rhonchi.  Musculoskeletal:     Cervical back: Normal range of motion and neck supple. No rigidity or tenderness.     Right lower leg: No edema.     Left lower leg: No edema.  Lymphadenopathy:     Cervical: No cervical adenopathy.  Skin:    General: Skin is warm and dry.     Coloration: Skin is not jaundiced.     Findings: No bruising, erythema or rash.  Neurological:     General: No focal deficit present.     Mental Status: She is alert and oriented to person, place, and time.     Sensory: No sensory deficit.     Motor: No weakness.  Psychiatric:        Mood  and Affect: Mood normal.        Behavior: Behavior normal.       The 10-year ASCVD risk score (Arnett DK, et al., 2019) is: 5.4%  Assessment & Plan:  Vitamin B12 deficiency -     B12 and Folate Panel  Anxiety and depression -     Escitalopram  Oxalate; Take 1 tablet (  5 mg total) by mouth daily.  Dispense: 90 tablet; Refill: 3  Dyspepsia -     Pantoprazole  Sodium; Take 1 tablet (40 mg total) by mouth daily.  Dispense: 90 tablet; Refill: 3  Hyperlipidemia, unspecified hyperlipidemia type -     Lipid panel -     Comprehensive metabolic panel with GFR -     Rosuvastatin  Calcium ; Take 1 tablet (40 mg total) by mouth daily.  Dispense: 90 tablet; Refill: 3  Abdominal obesity and metabolic syndrome -     CBC with Differential/Platelet -     Hemoglobin A1c -     TSH -     Lipid panel -     Comprehensive metabolic panel with GFR -     Mounjaro ; Inject 12.5 mg into the skin once a week.  Dispense: 6 mL; Refill: 3  Vitamin D  deficiency -     VITAMIN D  25 Hydroxy (Vit-D Deficiency, Fractures)  Screen for colon cancer -     Cologuard  Hyperglycemia -     Hemoglobin A1c  Pulmonary nodule -     CT CHEST LUNG CANCER SCREENING LOW DOSE WO CONTRAST; Future  Former smoker -     CT CHEST LUNG CANCER SCREENING LOW DOSE WO CONTRAST; Future  Weight gain -     TSH  Dysfunction of both eustachian tubes -     Fluticasone  Propionate; Place 2 sprays into both nostrils daily.  Dispense: 16 g; Refill: 6  Assessment and Plan    Obesity, on tirzepatide  for weight maintenance Obesity managed with tirzepatide , resulting in 50-pound weight loss. No issues with insurance or tolerance. - Continue tirzepatide  12.5 mg weekly for weight maintenance.  Gastroesophageal reflux disease with chronic cough and throat mucus Chronic cough and throat mucus possibly related to GERD. Symptoms may indicate insufficient control with pantoprazole . - Prescribe Flonase  to address potential Eustachian tube  dysfunction and throat mucus. - Continue pantoprazole  40 mg once daily. - If above ineffective, consider switching to dexilant daily  Eustachian tube dysfunction with prior tympanostomy Eustachian tube dysfunction with ear plugging during talking or singing, possibly related to jaw movement. - Prescribe Flonase  nasal spray.  Hyperlipidemia, on rosuvastatin  Hyperlipidemia well-controlled on rosuvastatin . - Continue rosuvastatin  40 mg daily. - Perform routine cholesterol monitoring.  Depression, stable on escitalopram  Depression well-managed on escitalopram . - Continue escitalopram  as prescribed.  Abnormal weight gain Recent weight gain noted for insurance coding to cover thyroid testing. - Order TSH test to evaluate thyroid function.         Return in about 6 months (around 06/21/2024) for Annual Physical.   Benton LITTIE Gave, PA

## 2023-12-22 LAB — TSH: TSH: 1.66 u[IU]/mL (ref 0.450–4.500)

## 2023-12-22 LAB — LIPID PANEL
Chol/HDL Ratio: 2.3 ratio (ref 0.0–4.4)
Cholesterol, Total: 176 mg/dL (ref 100–199)
HDL: 75 mg/dL (ref >39)
LDL Chol Calc (NIH): 86 mg/dL (ref 0–99)
Triglycerides: 81 mg/dL (ref 0–149)
VLDL Cholesterol Cal: 15 mg/dL (ref 5–40)

## 2023-12-22 LAB — COMPREHENSIVE METABOLIC PANEL WITH GFR
ALT: 22 IU/L (ref 0–32)
AST: 28 IU/L (ref 0–40)
Albumin: 4.2 g/dL (ref 3.9–4.9)
Alkaline Phosphatase: 43 IU/L — ABNORMAL LOW (ref 44–121)
BUN/Creatinine Ratio: 17 (ref 12–28)
BUN: 18 mg/dL (ref 8–27)
Bilirubin Total: 0.3 mg/dL (ref 0.0–1.2)
CO2: 22 mmol/L (ref 20–29)
Calcium: 9.8 mg/dL (ref 8.7–10.3)
Chloride: 104 mmol/L (ref 96–106)
Creatinine, Ser: 1.05 mg/dL — ABNORMAL HIGH (ref 0.57–1.00)
Globulin, Total: 2.2 g/dL (ref 1.5–4.5)
Glucose: 80 mg/dL (ref 70–99)
Potassium: 4.5 mmol/L (ref 3.5–5.2)
Sodium: 141 mmol/L (ref 134–144)
Total Protein: 6.4 g/dL (ref 6.0–8.5)
eGFR: 58 mL/min/1.73 — ABNORMAL LOW (ref >59)

## 2023-12-22 LAB — VITAMIN D 25 HYDROXY (VIT D DEFICIENCY, FRACTURES): Vit D, 25-Hydroxy: 36.8 ng/mL (ref 30.0–100.0)

## 2023-12-22 LAB — CBC WITH DIFFERENTIAL/PLATELET
Basophils Absolute: 0.1 x10E3/uL (ref 0.0–0.2)
Basos: 2 %
EOS (ABSOLUTE): 0.3 x10E3/uL (ref 0.0–0.4)
Eos: 7 %
Hematocrit: 36.9 % (ref 34.0–46.6)
Hemoglobin: 12.1 g/dL (ref 11.1–15.9)
Immature Grans (Abs): 0 x10E3/uL (ref 0.0–0.1)
Immature Granulocytes: 0 %
Lymphocytes Absolute: 2.4 x10E3/uL (ref 0.7–3.1)
Lymphs: 53 %
MCH: 31.4 pg (ref 26.6–33.0)
MCHC: 32.8 g/dL (ref 31.5–35.7)
MCV: 96 fL (ref 79–97)
Monocytes Absolute: 0.3 x10E3/uL (ref 0.1–0.9)
Monocytes: 6 %
Neutrophils Absolute: 1.5 x10E3/uL (ref 1.4–7.0)
Neutrophils: 32 %
Platelets: 231 x10E3/uL (ref 150–450)
RBC: 3.85 x10E6/uL (ref 3.77–5.28)
RDW: 12.4 % (ref 11.7–15.4)
WBC: 4.6 x10E3/uL (ref 3.4–10.8)

## 2023-12-22 LAB — B12 AND FOLATE PANEL
Folate: 7.4 ng/mL (ref >3.0)
Vitamin B-12: 417 pg/mL (ref 232–1245)

## 2023-12-22 LAB — HEMOGLOBIN A1C
Est. average glucose Bld gHb Est-mCnc: 103 mg/dL
Hgb A1c MFr Bld: 5.2 % (ref 4.8–5.6)

## 2023-12-23 ENCOUNTER — Encounter: Payer: Self-pay | Admitting: Urgent Care

## 2023-12-23 ENCOUNTER — Ambulatory Visit: Payer: Self-pay | Admitting: Urgent Care

## 2023-12-23 MED ORDER — FLUTICASONE PROPIONATE 50 MCG/ACT NA SUSP: 2.0000 | Freq: Every day | NASAL | 6 refills | Status: AC

## 2023-12-25 ENCOUNTER — Encounter: Payer: Self-pay | Admitting: Sports Medicine

## 2024-01-15 ENCOUNTER — Ambulatory Visit

## 2024-01-15 DIAGNOSIS — Z87891 Personal history of nicotine dependence: Secondary | ICD-10-CM | POA: Diagnosis not present

## 2024-01-15 DIAGNOSIS — Z122 Encounter for screening for malignant neoplasm of respiratory organs: Secondary | ICD-10-CM

## 2024-01-15 DIAGNOSIS — R911 Solitary pulmonary nodule: Secondary | ICD-10-CM

## 2024-01-18 ENCOUNTER — Other Ambulatory Visit: Payer: Self-pay | Admitting: Sports Medicine

## 2024-01-18 ENCOUNTER — Other Ambulatory Visit: Payer: Self-pay | Admitting: Urgent Care

## 2024-01-18 DIAGNOSIS — Z1231 Encounter for screening mammogram for malignant neoplasm of breast: Secondary | ICD-10-CM

## 2024-01-24 ENCOUNTER — Ambulatory Visit (INDEPENDENT_AMBULATORY_CARE_PROVIDER_SITE_OTHER)

## 2024-01-24 DIAGNOSIS — Z1231 Encounter for screening mammogram for malignant neoplasm of breast: Secondary | ICD-10-CM

## 2024-01-26 LAB — COLOGUARD: COLOGUARD: NEGATIVE

## 2024-03-14 ENCOUNTER — Encounter: Payer: Self-pay | Admitting: Urgent Care

## 2024-03-14 DIAGNOSIS — E669 Obesity, unspecified: Secondary | ICD-10-CM

## 2024-03-14 MED ORDER — MOUNJARO 12.5 MG/0.5ML ~~LOC~~ SOAJ: 12.5000 mg | SUBCUTANEOUS | 3 refills | Status: AC

## 2024-04-10 ENCOUNTER — Ambulatory Visit: Admitting: Sports Medicine

## 2024-05-04 ENCOUNTER — Ambulatory Visit

## 2024-05-06 ENCOUNTER — Ambulatory Visit
Admission: RE | Admit: 2024-05-06 | Discharge: 2024-05-06 | Disposition: A | Discharge status: Home / Self Care | Attending: Family Medicine | Admitting: Family Medicine

## 2024-05-06 VITALS — BP 112/69 | HR 64 | Temp 98.9°F | Resp 18 | Ht 63.0 in | Wt 130.0 lb

## 2024-05-06 DIAGNOSIS — R059 Cough, unspecified: Secondary | ICD-10-CM

## 2024-05-06 DIAGNOSIS — J069 Acute upper respiratory infection, unspecified: Secondary | ICD-10-CM

## 2024-05-06 MED ORDER — AMOXICILLIN-POT CLAVULANATE 875-125 MG PO TABS: 1.0000 | ORAL_TABLET | Freq: Two times a day (BID) | ORAL | 0 refills | Status: AC

## 2024-05-06 MED ORDER — HYDROCODONE BIT-HOMATROP MBR 5-1.5 MG/5ML PO SOLN: 5.0000 mL | Freq: Four times a day (QID) | ORAL | 0 refills | Status: AC | PRN

## 2024-05-06 NOTE — ED Triage Notes (Signed)
 Patient c/o non-productive cough for several weeks that's worse at night when lying down.  Denies any nasal drainage or afebrile.  Patient feels like she has post nasal drip and a lot of allergies.  Patient has taken OTC allergy meds and cough syrup.

## 2024-05-06 NOTE — ED Provider Notes (Signed)
 " Ann [name]    CSN: [id number] Arrival date & time: 05/06/24  1846      History   Chief Complaint Chief Complaint  Patient presents with   Cough    Entered by patient    HPI Ann [name] is a 69 y.o. female.   HPI Very pleasant 69 year old female presents with nonproductive cough for 3 weeks worse when lying down at night.  Patient reports that she will be flying to [facility] tomorrow morning to take [name] of her mother who is in intensive [name].  Patient reports her nerves are causing her more stress at the moment.  PMH significant for chronic bronchitis, pulmonary nodule, alcoholism, and RLS.   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 09/22/2020   Dyspepsia 07/27/2020   Anxiety and depression 11/04/2019   Urinary urgency 08/07/2017   Trochanteric bursitis, right hip 06/01/2016   Primary osteoarthritis of right knee 06/01/2016   Abdominal obesity and metabolic syndrome 06/01/2016   Restless leg syndrome 01/21/2016   Annual physical exam 11/12/2015   DDD (degenerative disc disease), cervical 11/12/2015   Benign paroxysmal positional vertigo 09/22/2015   Chronic bronchitis (HCC) 12/30/2014   Pulmonary nodule 09/19/2013   Hyperlipidemia 09/12/2013   Former smoker 09/12/2013   Lumbar degenerative disc disease 08/28/2012    Past Surgical History:  Procedure Laterality Date   CYSTOCELE REPAIR     HERNIA REPAIR  May 2012   Cystocele repair   JOINT REPLACEMENT  May 2017   Total, left hip   TUBAL LIGATION      OB History   No obstetric history on  file.      Home Medications    Prior to Admission medications  Medication Sig Start Date End Date Taking? Authorizing Provider  amoxicillin -clavulanate (AUGMENTIN ) 875-125 MG tablet Take 1 tablet by mouth every 12 (twelve) hours. 05/06/24  Yes Teddy Sharper, FNP  escitalopram  (LEXAPRO ) 5 MG tablet Take 1 tablet (5 mg total) by mouth daily. 12/21/23  Yes Crain, Whitney L, PA  HYDROcodone  bit-homatropine (HYCODAN) 5-1.5 MG/5ML syrup Take 5 mLs by mouth every 6 (six) hours as needed for cough. 05/06/24  Yes Labradford Schnitker, FNP  Multiple Vitamin (MULTIVITAMIN WITH MINERALS) TABS tablet Take 1 tablet by mouth daily. 12/14/14  Yes Nicholaus Leita LABOR, NP  pantoprazole  (PROTONIX ) 40 MG tablet Take 1 tablet (40 mg total) by mouth daily. 12/21/23  Yes Crain, Whitney L, PA  rosuvastatin  (CRESTOR ) 40 MG tablet Take 1 tablet (40 mg total) by mouth daily. 12/21/23  Yes Crain, Whitney L, PA  tirzepatide  (MOUNJARO ) 12.5 MG/0.5ML Pen Inject 12.5 mg into the skin once a week. 03/14/24  Yes Crain, Whitney L, PA  fluticasone  (FLONASE ) 50 MCG/ACT nasal spray Place 2 sprays into both nostrils daily. 12/23/23   Lowella Benton CROME, PA    Family History Family History  Problem Relation Age of Onset   Cancer Mother        breast   Alcohol abuse Mother    Arthritis Mother  Stroke Mother    Cancer Father        prostate   Arthritis Father    Hyperlipidemia Father    Alcohol abuse Paternal Uncle    Alcohol abuse Maternal Grandfather    Alcohol abuse Paternal Grandfather     Social History Social History[1]   Allergies   Naproxen and Desyrel [trazodone]   Review of Systems Review of Systems  Respiratory:  Positive for cough.   All other systems reviewed and are negative.    Physical Exam Triage Vital Signs ED Triage Vitals  Encounter Vitals Group     BP      Girls Systolic BP Percentile      Girls Diastolic BP Percentile      Boys Systolic BP Percentile      Boys Diastolic BP Percentile       Pulse      Resp      Temp      Temp src      SpO2      Weight      Height      Head Circumference      Peak Flow      Pain Score      Pain Loc      Pain Education      Exclude from Growth Chart    No data found.  Updated Vital Signs BP 112/69 (BP Location: Right Arm)   Pulse 64   Temp 98.9 F (37.2 C) (Oral)   Resp 18   Ht 5' 3 (1.6 m)   Wt 130 lb (59 kg)   SpO2 95%   BMI 23.03 kg/m    Physical Exam Vitals and nursing note reviewed.  Constitutional:      Appearance: Normal appearance. She is normal weight.  HENT:     Head: Normocephalic and atraumatic.     Mouth/Throat:     Mouth: Mucous membranes are moist.     Pharynx: Oropharynx is clear.  Eyes:     Extraocular Movements: Extraocular movements intact.     Conjunctiva/sclera: Conjunctivae normal.     Pupils: Pupils are equal, round, and reactive to light.  Cardiovascular:     Rate and Rhythm: Normal rate and regular rhythm.     Heart sounds: Normal heart sounds.  Pulmonary:     Effort: Pulmonary effort is normal.     Breath sounds: Normal breath sounds. No wheezing, rhonchi or rales.  Musculoskeletal:        General: Normal range of motion.  Skin:    General: Skin is warm and dry.  Neurological:     General: No focal deficit present.     Mental Status: She is alert and oriented to person, place, and time. Mental status is at baseline.  Psychiatric:        Mood and Affect: Mood normal.        Behavior: Behavior normal.      UC Treatments / Results  Labs (all labs ordered are listed, but only abnormal results are displayed) Labs Reviewed - No data to display  EKG   Radiology No results found.  Procedures Procedures (including critical [name] time)  Medications Ordered in UC Medications - No data to display  Initial Impression / Assessment and Plan / UC Course  I have reviewed the triage vital signs and the nursing notes.  Pertinent labs & imaging results that were available during my  [name] of the patient were reviewed by me and considered in  my medical decision making (see chart for details).    MDM: 1.  Acute URI-Rx'd Augmentin  875/125 mg tablet: Take 1 tablet twice daily x 7 days; 2.  Cough, unspecified type-Rx'd Hycodan 5-1.5 mg / 5 mL cough syrup: Take 5 mL p.o. every 6 hours, as needed for cough. Advised patient take medication as directed with food to completion.  Advised may take Hycodan cough syrup at night prior to sleep for cough due to sedative effects.  Encouraged to increase daily water intake to 64 ounces per day while taking these medications.  Advised if symptoms worsen and/or unresolved please follow-up with your PCP or here for further evaluation patient discharged home, hemodynamically stable. Final Clinical Impressions(s) / UC Diagnoses   Final diagnoses:  Cough, unspecified type  Acute URI     Discharge Instructions      Advised patient take medication as directed with food to completion.  Advised may take Hycodan cough syrup at night prior to sleep for cough due to sedative effects.  Encouraged to increase daily water intake to 64 ounces per day while taking these medications.  Advised if symptoms worsen and/or unresolved please follow-up with your PCP or here for further evaluation     ED Prescriptions     Medication Sig Dispense Auth. Provider   amoxicillin -clavulanate (AUGMENTIN ) 875-125 MG tablet Take 1 tablet by mouth every 12 (twelve) hours. 14 tablet Dmarco Baldus, FNP   HYDROcodone  bit-homatropine (HYCODAN) 5-1.5 MG/5ML syrup Take 5 mLs by mouth every 6 (six) hours as needed for cough. 120 mL Teddy Sharper, FNP      I have reviewed the PDMP during this encounter.    [1]  Social History Tobacco Use   Smoking status: Former    Current packs/day: 0.50    Average packs/day: 0.5 packs/day for 40.0 years (20.0 ttl pk-yrs)    Types: Cigarettes   Smokeless tobacco: Never  Vaping Use   Vaping status: Never Used  Substance Use Topics    Alcohol use: Not Currently    Alcohol/week: 70.0 standard drinks of alcohol    Types: 70 Cans of beer per week   Drug use: No     Teddy Sharper, FNP 05/06/24 1942  "

## 2024-05-06 NOTE — Discharge Instructions (Addendum)
 Advised patient take medication as directed with food to completion.  Advised may take Hycodan cough syrup at night prior to sleep for cough due to sedative effects.  Encouraged to increase daily water intake to 64 ounces per day while taking these medications.  Advised if symptoms worsen and/or unresolved please follow-up with your PCP or here for further evaluation
